# Patient Record
Sex: Female | Born: 1965
Health system: Southern US, Community
[De-identification: ages and names within clinical notes are randomized; demographics above are authoritative.]

## PROBLEM LIST (undated history)

## (undated) ENCOUNTER — Emergency Department (HOSPITAL_COMMUNITY): Admission: EM | Payer: Self-pay | Source: Home / Self Care

## (undated) DIAGNOSIS — F419 Anxiety disorder, unspecified: Secondary | ICD-10-CM

## (undated) DIAGNOSIS — K219 Gastro-esophageal reflux disease without esophagitis: Secondary | ICD-10-CM

## (undated) DIAGNOSIS — K589 Irritable bowel syndrome without diarrhea: Secondary | ICD-10-CM

## (undated) HISTORY — PX: OTHER SURGICAL HISTORY: SHX169

---

## 1998-09-21 ENCOUNTER — Other Ambulatory Visit: Admission: RE | Admit: 1998-09-21 | Discharge: 1998-09-21 | Payer: Self-pay | Admitting: *Deleted

## 1999-09-27 ENCOUNTER — Other Ambulatory Visit: Admission: RE | Admit: 1999-09-27 | Discharge: 1999-09-27 | Payer: Self-pay | Admitting: *Deleted

## 2000-09-27 ENCOUNTER — Other Ambulatory Visit: Admission: RE | Admit: 2000-09-27 | Discharge: 2000-09-27 | Payer: Self-pay | Admitting: *Deleted

## 2004-09-23 ENCOUNTER — Ambulatory Visit: Payer: Self-pay | Admitting: Family Medicine

## 2004-10-21 ENCOUNTER — Ambulatory Visit: Payer: Self-pay | Admitting: Family Medicine

## 2004-11-02 ENCOUNTER — Ambulatory Visit: Payer: Self-pay | Admitting: Family Medicine

## 2005-07-06 ENCOUNTER — Ambulatory Visit: Payer: Self-pay | Admitting: Family Medicine

## 2017-05-26 DIAGNOSIS — H5203 Hypermetropia, bilateral: Secondary | ICD-10-CM | POA: Diagnosis not present

## 2019-08-13 ENCOUNTER — Observation Stay (HOSPITAL_COMMUNITY): Payer: No Typology Code available for payment source

## 2019-08-13 ENCOUNTER — Emergency Department (HOSPITAL_COMMUNITY): Payer: No Typology Code available for payment source

## 2019-08-13 ENCOUNTER — Other Ambulatory Visit: Payer: Self-pay

## 2019-08-13 ENCOUNTER — Observation Stay (HOSPITAL_COMMUNITY)
Admission: EM | Admit: 2019-08-13 | Discharge: 2019-08-14 | Disposition: A | Discharge status: Home / Self Care | Payer: No Typology Code available for payment source | Attending: Family Medicine | Admitting: Family Medicine

## 2019-08-13 ENCOUNTER — Encounter (HOSPITAL_COMMUNITY): Payer: Self-pay

## 2019-08-13 DIAGNOSIS — R200 Anesthesia of skin: Secondary | ICD-10-CM

## 2019-08-13 DIAGNOSIS — Z20828 Contact with and (suspected) exposure to other viral communicable diseases: Secondary | ICD-10-CM | POA: Insufficient documentation

## 2019-08-13 DIAGNOSIS — Z79899 Other long term (current) drug therapy: Secondary | ICD-10-CM | POA: Diagnosis not present

## 2019-08-13 DIAGNOSIS — R42 Dizziness and giddiness: Secondary | ICD-10-CM | POA: Diagnosis not present

## 2019-08-13 DIAGNOSIS — G459 Transient cerebral ischemic attack, unspecified: Principal | ICD-10-CM

## 2019-08-13 DIAGNOSIS — M7989 Other specified soft tissue disorders: Secondary | ICD-10-CM | POA: Insufficient documentation

## 2019-08-13 DIAGNOSIS — R030 Elevated blood-pressure reading, without diagnosis of hypertension: Secondary | ICD-10-CM

## 2019-08-13 DIAGNOSIS — I10 Essential (primary) hypertension: Secondary | ICD-10-CM | POA: Diagnosis present

## 2019-08-13 HISTORY — DX: Gastro-esophageal reflux disease without esophagitis: K21.9

## 2019-08-13 HISTORY — DX: Anxiety disorder, unspecified: F41.9

## 2019-08-13 HISTORY — DX: Irritable bowel syndrome, unspecified: K58.9

## 2019-08-13 LAB — SARS CORONAVIRUS 2 (TAT 6-24 HRS): SARS Coronavirus 2: NEGATIVE

## 2019-08-13 LAB — CBC
HCT: 44.2 % (ref 36.0–46.0)
Hemoglobin: 14.4 g/dL (ref 12.0–15.0)
MCH: 29.1 pg (ref 26.0–34.0)
MCHC: 32.6 g/dL (ref 30.0–36.0)
MCV: 89.5 fL (ref 80.0–100.0)
Platelets: 261 10*3/uL (ref 150–400)
RBC: 4.94 MIL/uL (ref 3.87–5.11)
RDW: 13.2 % (ref 11.5–15.5)
WBC: 7.1 10*3/uL (ref 4.0–10.5)
nRBC: 0 % (ref 0.0–0.2)

## 2019-08-13 LAB — URINALYSIS, ROUTINE W REFLEX MICROSCOPIC
Bilirubin Urine: NEGATIVE
Glucose, UA: NEGATIVE mg/dL
Ketones, ur: NEGATIVE mg/dL
Nitrite: NEGATIVE
Protein, ur: NEGATIVE mg/dL
Specific Gravity, Urine: 1.003 — ABNORMAL LOW (ref 1.005–1.030)
pH: 7 (ref 5.0–8.0)

## 2019-08-13 LAB — BASIC METABOLIC PANEL
Anion gap: 10 (ref 5–15)
BUN: 8 mg/dL (ref 6–20)
CO2: 25 mmol/L (ref 22–32)
Calcium: 9.7 mg/dL (ref 8.9–10.3)
Chloride: 105 mmol/L (ref 98–111)
Creatinine, Ser: 0.66 mg/dL (ref 0.44–1.00)
GFR calc Af Amer: >60 mL/min (ref >60)
GFR calc non Af Amer: >60 mL/min (ref >60)
Glucose, Bld: 110 mg/dL — ABNORMAL HIGH (ref 70–99)
Potassium: 3.8 mmol/L (ref 3.5–5.1)
Sodium: 140 mmol/L (ref 135–145)

## 2019-08-13 LAB — RAPID URINE DRUG SCREEN, HOSP PERFORMED
Amphetamines: NOT DETECTED
Barbiturates: NOT DETECTED
Benzodiazepines: POSITIVE — AB
Cocaine: NOT DETECTED
Opiates: NOT DETECTED
Tetrahydrocannabinol: NOT DETECTED

## 2019-08-13 LAB — TROPONIN I (HIGH SENSITIVITY)
Troponin I (High Sensitivity): <2 ng/L (ref <18)
Troponin I (High Sensitivity): <2 ng/L (ref <18)

## 2019-08-13 LAB — HEMOGLOBIN A1C
Hgb A1c MFr Bld: 6.1 % — ABNORMAL HIGH (ref 4.8–5.6)
Mean Plasma Glucose: 128.37 mg/dL

## 2019-08-13 LAB — CBG MONITORING, ED: Glucose-Capillary: 94 mg/dL (ref 70–99)

## 2019-08-13 LAB — TSH: TSH: 0.865 u[IU]/mL (ref 0.350–4.500)

## 2019-08-13 LAB — HIV ANTIBODY (ROUTINE TESTING W REFLEX): HIV Screen 4th Generation wRfx: NONREACTIVE

## 2019-08-13 MED ORDER — LORAZEPAM 2 MG/ML IJ SOLN: 0.5000 mg | Freq: Once | INTRAMUSCULAR | Status: DC

## 2019-08-13 MED ORDER — LORAZEPAM 0.5 MG PO TABS
ORAL_TABLET | ORAL | Status: AC
  Administered 2019-08-13: 0.5 mg
  Filled 2019-08-13: qty 1

## 2019-08-13 MED ORDER — ASPIRIN EC 81 MG PO TBEC
81.0000 mg | DELAYED_RELEASE_TABLET | Freq: Every day | ORAL | Status: DC
  Administered 2019-08-14: 81 mg via ORAL
  Filled 2019-08-13: qty 1

## 2019-08-13 MED ORDER — ACETAMINOPHEN 325 MG PO TABS
650.0000 mg | ORAL_TABLET | Freq: Four times a day (QID) | ORAL | Status: DC | PRN
  Administered 2019-08-14: 650 mg via ORAL
  Filled 2019-08-13: qty 2

## 2019-08-13 MED ORDER — SODIUM CHLORIDE 0.9% FLUSH: 3.0000 mL | Freq: Once | INTRAVENOUS | Status: DC

## 2019-08-13 MED ORDER — ENOXAPARIN SODIUM 40 MG/0.4ML ~~LOC~~ SOLN
40.0000 mg | SUBCUTANEOUS | Status: DC
  Filled 2019-08-13: qty 0.4

## 2019-08-13 MED ORDER — HYDRALAZINE HCL 20 MG/ML IJ SOLN: 5.0000 mg | Freq: Four times a day (QID) | INTRAMUSCULAR | Status: DC | PRN

## 2019-08-13 NOTE — H&P (Signed)
History and Physical    Susan Abbott YBO:[id number] DOB: 05/17/1966 DOA: 08/13/2019  PCP: [facility] Patient coming from: Home  Chief Complaint: Right facial numbness and dizziness started at 11 AM today.  HPI: Susan Abbott is a 53 y.o. 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  She was at work today and she felt the sudden onset of right sided facial numbness probably lasted for 2 hours.  She checked her blood pressure it was 170s over 100s 3 times.  She took a hydrochlorothiazide which she takes as needed for fluid buildup.  She does not have a history of hypertension hyperlipidemia or diabetes.  She is not a smoker she does not drink alcohol.  When her blood pressure was that high she felt dizzy and she thought she was going to pass out but did not pass out.  This was also associated with some palpitations and left shoulder pain.  She thinks the whole episode lasted 1 to 2 hours.  When I saw her she was completely free of symptoms.  She denied dysphagia dyspnea chest pain shortness of breath nausea vomiting diarrhea. She denied changes with her vision she has a mild headache. She denied any urinary complaints. She works as a Publishing rights manager in the clinic so she must have had exposure to asymptomatic Covid patients.  But she herself has no symptoms or complaints of loss of taste or loss of smell. She denied any similar symptoms in the past.  ED Course: Blood pressure 1 4592 pulse is 68 respiration 15-25 sats 99% on room air temperature 99.2.  Sodium 140 potassium 3.8 BUN 8 creatinine 0.66 white count 7.1 hemoglobin 14 platelet count 261 glucose 110.  UA moderate amount of leukocytes.  Urine tox screen positive for benzodiazepines.  CT head and MRI is pending.  Review of Systems: As per HPI otherwise all other systems reviewed and are negative   Ambulatory Status: Patient is very active at baseline.  Past Medical History:  Diagnosis Date  . Anxiety   . GERD (gastroesophageal reflux disease)   . IBS (irritable bowel syndrome)     History reviewed. No pertinent surgical history.  Social History   Socioeconomic History  . Marital status: Married    Spouse name: Not on file  . Number of children: Not on file  . Years of education: Not on file  . Highest education level: Not on file  Occupational History  . Not on file  Social Needs  . Financial resource strain: Not on file  . Food insecurity    Worry: Not on file    Inability: Not on file  . Transportation needs    Medical: Not on file    Non-medical: Not on file  Tobacco Use  . Smoking status: Never Smoker  . Smokeless tobacco: Never Used  Substance and Sexual Activity  . Alcohol use: Never    Frequency: Never  . Drug use: Never  . Sexual activity: Not on file  Lifestyle  . Physical activity    Days per week: Not on file    Minutes per session: Not on file  . Stress: Not on file  Relationships  . Social Musician on phone: Not on file    Gets together: Not on file    Attends religious service: Not on file  Active member of club or organization: Not on file    Attends meetings of clubs or organizations: Not on file    Relationship status: Not on file  . Intimate partner violence    Fear of current or ex partner: Not on file    Emotionally abused: Not on file    Physically abused: Not on file    Forced sexual activity: Not on file  Other Topics Concern  . Not on file  Social History Narrative  . Not on file    Allergies  Allergen Reactions  . Doxycycline     Stomach cramps  . Shellfish Allergy   . Solu-Medrol [Methylprednisolone Sodium Succ]     "Goes Crazy"    History reviewed. No pertinent family history.    Prior to Admission medications   Medication Sig Start Date End Date Taking? Authorizing Provider  acetaminophen  (TYLENOL) 325 MG tablet Take 650 mg by mouth every 6 (six) hours as needed for mild pain.   Yes [provider]  diazepam (VALIUM) 5 MG tablet Take 5 mg by mouth daily as needed for anxiety.   Yes [provider]  hydrochlorothiazide (HYDRODIURIL) 25 MG tablet Take 25 mg by mouth daily as needed (fluid).    Yes [provider]    Physical Exam: Vitals:   08/13/19 1327 08/13/19 1329 08/13/19 1330  BP:  (!) 145/85 (!) 145/92  Pulse: 66 67 69  Resp: 17 18 (!) 25  Temp:  99 F (37.2 C) 99.2 F (37.3 C)  TempSrc:  Oral Oral  SpO2: 99% 98% 99%  Weight:  65.8 kg   Height:  5\' 3"  (1.6 m)      . General:  Appears calm and comfortable . Eyes: PERRL, EOMI, normal lids, iris . ENT:  grossly normal hearing, lips & tongue, mmm . Neck: no LAD, masses or thyromegaly . Cardiovascular:  RRR, no m/r/g. No LE edema.  Marland Kitchen Respiratory: CTA bilaterally, no w/r/r. Normal respiratory effort. . Abdomen:  soft, ntnd, NABS . Skin:  no rash or induration seen on limited exam . Musculoskeletal:  grossly normal tone BUE/BLE, good ROM, no bony abnormality . Psychiatric:  grossly normal mood and affect, speech fluent and appropriate, AOx3 . Neurologic:  CN 2-12 grossly intact, moves all extremities in coordinated fashion, sensation intact  Labs on Admission: I have personally reviewed following labs and imaging studies  CBC: Recent Labs  Lab 08/13/19 1328  WBC 7.1  HGB 14.4  HCT 44.2  MCV 89.5  PLT 619   Basic Metabolic Panel: Recent Labs  Lab 08/13/19 1328  NA 140  K 3.8  CL 105  CO2 25  GLUCOSE 110*  BUN 8  CREATININE 0.66  CALCIUM 9.7   GFR: Estimated Creatinine Clearance: 73.4 mL/min (by C-G formula based on SCr of 0.66 mg/dL). Liver Function Tests: No results for input(s): AST, ALT, ALKPHOS, BILITOT, PROT, ALBUMIN in the last 168 hours. No results for input(s): LIPASE, AMYLASE in the last 168 hours. No results for input(s): AMMONIA in the last 168 hours.  Coagulation Profile: No results for input(s): INR, PROTIME in the last 168 hours. Cardiac Enzymes: No results for input(s): CKTOTAL, CKMB, CKMBINDEX, TROPONINI in the last 168 hours. BNP (last 3 results) No results for input(s): PROBNP in the last 8760 hours. HbA1C: No results for input(s): HGBA1C in the last 72 hours. CBG: Recent Labs  Lab 08/13/19 1335  GLUCAP 94   Lipid Profile: No results for input(s): CHOL, HDL, LDLCALC,  TRIG, CHOLHDL, LDLDIRECT in the last 72 hours. Thyroid Function Tests: No results for input(s): TSH, T4TOTAL, FREET4, T3FREE, THYROIDAB in the last 72 hours. Anemia Panel: No results for input(s): VITAMINB12, FOLATE, FERRITIN, TIBC, IRON, RETICCTPCT in the last 72 hours. Urine analysis:    Component Value Date/Time   COLORURINE STRAW (A) 08/13/2019 1328   APPEARANCEUR CLEAR 08/13/2019 1328   LABSPEC 1.003 (L) 08/13/2019 1328   PHURINE 7.0 08/13/2019 1328   GLUCOSEU NEGATIVE 08/13/2019 1328   HGBUR SMALL (A) 08/13/2019 1328   BILIRUBINUR NEGATIVE 08/13/2019 1328   KETONESUR NEGATIVE 08/13/2019 1328   PROTEINUR NEGATIVE 08/13/2019 1328   NITRITE NEGATIVE 08/13/2019 1328   LEUKOCYTESUR MODERATE (A) 08/13/2019 1328    Creatinine Clearance: Estimated Creatinine Clearance: 73.4 mL/min (by C-G formula based on SCr of 0.66 mg/dL).  Sepsis Labs: (procalcitonin:4,lacticidven:4) )No results found for this or any previous visit (from the past 240 hour(s)).   Radiological Exams on Admission: No results found.  EKG: Reviewed by me no acute ST-T wave changes normal sinus rhythm  Assessment/Plan Active Problems:   TIA (transient ischemic attack)    #1 possible TIA-patient admitted with right-sided facial numbness and right-sided facial pain which lasted for less than 2 hours and has resolved completely by the time she came to the ER. ED PA spoke with Dr. Amada Jupiter who recommended admission to [facility].  He recommended a CT angio of  the head and neck along with MRI but the patient reported a prior history of contrast allergy and she is allergic to shellfish and she was not wanting [place] to have a CT angiogram done. Since she refused a CT angiogram I will order carotid ultrasound and echocardiogram. Check TSH hemoglobin A1c and lipid panel. Start asa   #2 hypertension patient with no history of hypertension she takes HCTZ at home as needed for edema her blood pressure was elevated today into 170s to 100 during the episode above.  I will put her on as needed hydralazine.  If blood pressure remains high she will need to be started on an oral agent.  #3 anxiety patient takes Valium as needed at home she reports she does not take it every day   #4 mild hyperglycemia check hemoglobin A1c  Severity of Illness: The appropriate patient status for this patient is OBSERVATION. Observation status is judged to be reasonable and necessary in order to provide the required intensity of service to ensure the patient's safety. The patient's presenting symptoms, physical exam findings, and initial radiographic and laboratory data in the context of their medical condition is felt to place them at decreased risk for further clinical deterioration. Furthermore, it is anticipated that the patient will be medically stable for discharge from the hospital within 2 midnights of admission. The following factors support the patient status of observation.   " The patient's presenting symptoms include right sided facial numbness and hypertension. " The physical exam findings include right facial numbness " The initial radiographic and laboratory data are pending   Estimated body mass index is 25.69 kg/m as calculated from the following:   Height as of this encounter:  (1.6 m).   Weight as of this encounter: 65.8 kg.   DVT prophylaxis: Lovenox Code Status: Full code Family Communication: Discussed with husband in the room Disposition Plan: Admit  to [facility] Consults called: ED physician discussed with Dr. Amada Jupiter Admission status: Observation   Alwyn Ren MD [facility]  If 7PM-7AM, please contact night-coverage [email or web address] Password  [email or web address]  08/13/2019, 4:26 PM

## 2019-08-13 NOTE — ED Notes (Signed)
Patient verbalized that she would like to speak to a provider about her care, stating that she would prefer to follow up with outpatient if able. This nurse explained that I would page the hospitalist to see if they would come speak with her.

## 2019-08-13 NOTE — ED Provider Notes (Addendum)
[facility]-EMERGENCY DEPT Provider Note   CSN: [id number] Arrival date & time: 08/13/19  1304     History   Chief Complaint Chief Complaint  Patient presents with   Hypertension   Dizziness    HPI Susan Abbott is a 53 y.o. female.     53 y.o female with a PMH of SVT presents to the ED via EMS with a chief complaint of HTN and dizziness. 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 She reports checking her BP by the LPN with reading between the 174 systolic and lower 100s diastolic.  She also reports after this check she felt like she was so weak and lightheaded that she could syncopized but did not do so.  During this episode she also felt some left shoulder pain briefly.  The episode lasted for a total of 30 minutes and resolved without any deficits.  On arrival patient endorses a headache, reports a dull sensation to the top of her head without any radiation or blurred vision.  Patient reports trying to take a 25 mg of HCTZ which she currently takes for fluid blood pressure.  She denies any prior history of hypertension, CAD, smoking.  She denies any shortness of breath, chest pain last saw a cardiologist "a decade ago" for her SVT.    The history is provided by the patient and medical records.      OB History   No obstetric history on file.      Home Medications    Prior to Admission medications   Medication Sig Start Date End Date Taking? Authorizing Provider  acetaminophen (TYLENOL) 325 MG tablet Take 650 mg by mouth every 6 (six) hours as needed for mild pain.   Yes [provider]  diazepam (VALIUM) 5 MG tablet Take 5 mg by mouth daily as needed for anxiety.   Yes [provider]  hydrochlorothiazide (HYDRODIURIL) 25 MG tablet Take 25 mg by mouth daily as needed (fluid).    Yes  [provider]    Family History No family history on file.  Social History Social History   Tobacco Use   Smoking status: Not on file  Substance Use Topics   Alcohol use: Not on file   Drug use: Not on file     Allergies   Doxycycline, Shellfish allergy, and Solu-medrol [methylprednisolone sodium succ]   Review of Systems Review of Systems  Constitutional: Negative for chills and fever.  HENT: Negative for ear pain and sore throat.   Eyes: Negative for pain and visual disturbance.  Respiratory: Negative for cough and shortness of breath.   Cardiovascular: Negative for chest pain and palpitations.  Gastrointestinal: Negative for abdominal pain, nausea and vomiting.  Genitourinary: Negative for dysuria and hematuria.  Musculoskeletal: Negative for arthralgias and back pain.  Skin: Negative for color change and rash.  Neurological: Positive for dizziness, syncope, weakness, light-headedness and headaches. Negative for seizures and speech difficulty.  Psychiatric/Behavioral: Negative for confusion.  All other systems reviewed and are negative.    Physical Exam Updated Vital Signs BP (!) 145/92    Pulse 69    Temp 99.2 F (37.3 C) (Oral)    Resp (!) 25    Ht 5\' 3"  (1.6 m)    Wt 65.8 kg    SpO2 99%    BMI 25.69  kg/m   Physical Exam Vitals signs and nursing note reviewed.  Constitutional:      Appearance: Normal appearance.  HENT:     Head: Normocephalic and atraumatic.     Nose: Nose normal.     Mouth/Throat:     Mouth: Mucous membranes are moist.  Eyes:     General: No scleral icterus.       Right eye: No discharge.        Left eye: No discharge.     Extraocular Movements: Extraocular movements intact.     Pupils: Pupils are equal, round, and reactive to light.  Neck:     Musculoskeletal: Normal range of motion and neck supple.  Cardiovascular:     Rate and Rhythm: Normal rate.  Pulmonary:     Effort: Pulmonary effort is normal.     Breath  sounds: Normal breath sounds. No wheezing or rhonchi.  Abdominal:     General: Abdomen is flat.  Neurological:     Mental Status: She is alert and oriented to person, place, and time.     Comments: Alert, oriented, thought content appropriate. Speech fluent without evidence of aphasia. Able to follow 2 step commands without difficulty.  Cranial Nerves:  II:  Peripheral visual fields grossly normal, pupils, round, reactive to light III,IV, VI: ptosis not present, extra-ocular motions intact bilaterally  V,VII: smile symmetric, facial light touch sensation equal VIII: hearing grossly normal bilaterally  IX,X: midline uvula rise  XI: bilateral shoulder shrug equal and strong XII: midline tongue extension  Motor:  5/5 in upper and lower extremities bilaterally including strong and equal grip strength and dorsiflexion/plantar flexion Sensory: light touch normal in all extremities.  Cerebellar: normal finger-to-nose with bilateral upper extremities, pronator drift negative        ED Treatments / Results  Labs (all labs ordered are listed, but only abnormal results are displayed) Labs Reviewed  BASIC METABOLIC PANEL - Abnormal; Notable for the following components:      Result Value   Glucose, Bld 110 (*)    All other components within normal limits  URINALYSIS, ROUTINE W REFLEX MICROSCOPIC - Abnormal; Notable for the following components:   Color, Urine STRAW (*)    Specific Gravity, Urine 1.003 (*)    Hgb urine dipstick SMALL (*)    Leukocytes,Ua MODERATE (*)    Bacteria, UA RARE (*)    Non Squamous Epithelial 0-5 (*)    All other components within normal limits  RAPID URINE DRUG SCREEN, HOSP PERFORMED - Abnormal; Notable for the following components:   Benzodiazepines POSITIVE (*)    All other components within normal limits  SARS CORONAVIRUS 2 (TAT 6-24 HRS)  CBC  CBG MONITORING, ED  TROPONIN I (HIGH SENSITIVITY)  TROPONIN I (HIGH SENSITIVITY)     EKG None  Radiology No results found.  Procedures Procedures (including critical care time)  Medications Ordered in ED Medications  sodium chloride flush (NS) 0.9 % injection 3 mL (3 mLs Intravenous Not Given 08/13/19 1416)     Initial Impression / Assessment and Plan / ED Course  I have reviewed the triage vital signs and the nursing notes.  Pertinent labs & imaging results that were available during my care of the patient were reviewed by me and considered in my medical decision making (see chart for details).  Clinical Course as of Aug 12 1541  Tue Aug 13, 2019  1533 Benzodiazepines(!): POSITIVE [JS]  1533 Chalmers Guest): MODERATE [JS]  1533 WBC, UA: 6-10 [JS]  Clinical Course User Index [JS] Claude Manges, PA-C      Patient with no past medical history presents to the ED with complaints of right-sided facial numbness, right-sided jaw pain along with left shoulder pain which began while she was at work.  Patient is currently employed as an NP, reports measuring her blood pressure with a systolic rate around the 170s along with a diastolic rate along the 100s.  She reports she felt like she was unable to ambulate as she felt very weak, has some dizziness along with lightheadedness.  The symptoms self resolved within 30 minutes.  Patient has not had any similar episodes in the past, does not have a history of CAD, has no previous history of strokes, currently not on any blood thinners and denies any trauma.  Patient did take a 25 mg of HCTZ which she currently uses for fluid not blood pressure management.  She denies any smoking history. Suspicion for TIA as symptoms have now self resolved.  She did arrive in the ED with some mild tachypnea, blood pressure slightly elevated at 145/92 but she is overall well-appearing.  Neuro exam performed by EMS negative.  Patient was neurologically screened by me, no deficits on my exam.  Patient does voice a prior history of SVT, was  previously released by cardiologist several years ago.  Has not had any follow-up since.  BMP without any electrolyte derangement, glucose is slightly elevated.  Kidney level is within normal limits.  CBC is unremarkable.  UDS did test positive for benzos.  UA does have moderate amount of leukocytes, some rare bacteria she denies any urinary symptoms at this time.  Will call neurology on-call for further recommendations.  EKG without any changes consistent with infarct or STEMI.  First troponin is negative.  Low suspicion for ACS, symptoms seem somewhat atypical.  2:43 PM Spoke to Dr. Amada Jupiter of neurology who commended admission under hospitalist service.  Patient will require transfer to [facility].  Due to delay in transfers, CT angio head and neck along with MRI will be ordered while in the ED at [facility].  3:34 PM patient reports a prior history of contrast allergy.  She reports she has never received contrast as she is currently allergic to shellfish.  Will defer CT at this time per patient's request.  3:41 PM spoke to Dr. Ashley Royalty hospitalist service who will admit patient for further management and TIA admission.  Of note, patient did refuse contrast for me.  Will be obtaining CT head along with MRI brain.  COVID-19 testing has been ordered.  I have discussed patient with Dr. Stevie Kern who has seen patient, and agrees with management.    Portions of this note were generated with Scientist, clinical (histocompatibility and immunogenetics). Dictation errors may occur despite best attempts at proofreading.  Final Clinical Impressions(s) / ED Diagnoses   Final diagnoses:  Facial numbness  Elevated blood pressure reading  Dizziness    ED Discharge Orders    None       Claude Manges, PA-C 08/13/19 1542    Claude Manges, PA-C 08/13/19 1543    Milagros Loll, MD 08/14/19 1013

## 2019-08-13 NOTE — ED Notes (Signed)
Patient ambulated to the restroom with no difficulty   ° °

## 2019-08-13 NOTE — ED Notes (Signed)
[facility] arrived for transport 

## 2019-08-13 NOTE — ED Notes (Signed)
Hospitalist paged

## 2019-08-13 NOTE — ED Notes (Signed)
[facility] called for transport. 

## 2019-08-13 NOTE — ED Notes (Signed)
Patient transported to MRI 

## 2019-08-13 NOTE — ED Triage Notes (Signed)
[facility]. Patient reportedly had bout of high BP, and right sided facial numbness. Patient reports sensation differences in EMS truck. Stroke screen negative with EMS.  Patient denies hx of HTN. Patient reports numbness has resolved at this time. Patient was given HCTZ on scene.

## 2019-08-13 NOTE — ED Notes (Signed)
Attempted to call report, nurse unavailable at this time.

## 2019-08-14 ENCOUNTER — Observation Stay (HOSPITAL_BASED_OUTPATIENT_CLINIC_OR_DEPARTMENT_OTHER): Payer: No Typology Code available for payment source

## 2019-08-14 ENCOUNTER — Encounter (HOSPITAL_COMMUNITY): Payer: Self-pay | Admitting: Neurology

## 2019-08-14 DIAGNOSIS — G459 Transient cerebral ischemic attack, unspecified: Secondary | ICD-10-CM

## 2019-08-14 DIAGNOSIS — R200 Anesthesia of skin: Secondary | ICD-10-CM | POA: Diagnosis not present

## 2019-08-14 LAB — ECHOCARDIOGRAM COMPLETE
Height: 63 in
Weight: 2320 oz

## 2019-08-14 LAB — BASIC METABOLIC PANEL
Anion gap: 12 (ref 5–15)
BUN: 9 mg/dL (ref 6–20)
CO2: 23 mmol/L (ref 22–32)
Calcium: 9.4 mg/dL (ref 8.9–10.3)
Chloride: 104 mmol/L (ref 98–111)
Creatinine, Ser: 0.9 mg/dL (ref 0.44–1.00)
GFR calc Af Amer: >60 mL/min (ref >60)
GFR calc non Af Amer: >60 mL/min (ref >60)
Glucose, Bld: 133 mg/dL — ABNORMAL HIGH (ref 70–99)
Potassium: 3.9 mmol/L (ref 3.5–5.1)
Sodium: 139 mmol/L (ref 135–145)

## 2019-08-14 MED ORDER — ASPIRIN 81 MG PO TBEC: 81.0000 mg | DELAYED_RELEASE_TABLET | Freq: Every day | ORAL | 0 refills | Status: AC

## 2019-08-14 MED ORDER — DIAZEPAM 5 MG PO TABS: 5.0000 mg | ORAL_TABLET | Freq: Once | ORAL | Status: DC

## 2019-08-14 NOTE — Plan of Care (Signed)
Adequate for discharge.

## 2019-08-14 NOTE — Progress Notes (Signed)
VASCULAR LAB PRELIMINARY  PRELIMINARY  PRELIMINARY  PRELIMINARY  Carotid duplex completed.    Preliminary report:  See CV proc for preliminary results.   Jannessa Ogden, RVT 08/14/2019, 12:05 PM

## 2019-08-14 NOTE — Progress Notes (Signed)
  Echocardiogram 2D Echocardiogram has been performed.  Lilah, Mijangos 08/14/2019, 10:56 AM

## 2019-08-14 NOTE — Discharge Summary (Addendum)
Physician Discharge Summary  [name] EAV:[id number] DOB: 1966-09-08 DOA: 08/13/2019  PCP: [facility]  Admit date: 08/13/2019 Discharge date: 08/14/2019  Admitted From: Home Disposition: Home  Recommendations for Outpatient Follow-up:  1. Follow up with PCP in 1-2 weeks 2. Please have MRA of the head and lipid panel checked as an outpatient 3. Please obtain BMP/CBC in one week 4. Please follow up on the following pending results:  Home Health: None Equipment/Devices: None  Discharge Condition: Stable CODE STATUS: Full code Diet recommendation: Regular  Subjective: Seen and examined she has no complaints.  Her numbness on the right face had resolved soon before coming to the ER and she does not have any focal deficit or any other complaint.  Brief/Interim Summary: Susan Abbott is a 53 y.o. 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  She was at work today and she felt the sudden onset of right sided facial numbness probably lasted for 2 hours.  She checked her blood pressure it was 170s over 100s 3 times.  She took a hydrochlorothiazide which she takes as needed for fluid buildup.  She does not have a history of hypertension hyperlipidemia or diabetes.  She is not a smoker she does not drink alcohol.  When her blood pressure was that high she felt dizzy and she thought she was going to pass out but did not pass out.  This was also associated with some palpitations and left shoulder pain.  She came to ED to seek further medical attention.  She was tested negative for COVID-19.  Her vitals were stable in the emergency department and work-up was almost unremarkable.  CT of head followed by MRI of the brain was unremarkable for any acute pathology.  Transthoracic echo was unremarkable with normal ejection fraction and no wall motion abnormality.   Carotid Doppler also did not show significant stenosis.  She was evaluated by neurology and they had recommended MRI of the head and lipid panel however patient did not want to wait for that and wanted to leave AGAINST MEDICAL ADVICE if not done on time.  I discussed case with Dr. Amada Jupiter from neurology and he was okay with patient going home and having those done as outpatient.  Based on the work-up, she likely had a TIA.  She has been discharged in stable condition.  Discharge Diagnoses:  Active Problems:   TIA (transient ischemic attack)    Discharge Instructions  Discharge Instructions    Discharge patient   Complete by: As directed    Discharge disposition: 01-Home or Self Care   Discharge patient date: 08/14/2019     Allergies as of 08/14/2019      Reactions   Doxycycline    Stomach cramps   Shellfish Allergy    Solu-medrol [methylprednisolone Sodium Succ]    "Goes Crazy"      Medication List    TAKE these medications   acetaminophen 325 MG tablet Commonly known as: TYLENOL Take 650 mg by mouth every 6 (six) hours as needed for mild pain.   aspirin 81 MG EC tablet Take 1 tablet (81 mg total) by mouth daily. Start taking on: August 15, 2019   diazepam 5 MG tablet Commonly known as: VALIUM Take 5 mg by mouth daily as needed for anxiety.   hydrochlorothiazide 25 MG tablet Commonly known as: HYDRODIURIL Take 25  mg by mouth daily as needed (fluid).      Follow-up Information    [facility] Follow up in 1 week(s).   Specialty: Internal Medicine Contact information: [address] [place] [address] [phone]          Allergies  Allergen Reactions  . Doxycycline     Stomach cramps  . Shellfish Allergy   . Solu-Medrol [Methylprednisolone Sodium Succ]     "Goes Crazy"    Consultations: Neurology   Procedures/Studies: Ct Head Wo Contrast  Result Date: 08/13/2019 CLINICAL DATA:  Right-sided facial numbness EXAM: CT HEAD  WITHOUT CONTRAST TECHNIQUE: Contiguous axial images were obtained from the base of the skull through the vertex without intravenous contrast. COMPARISON:  None. FINDINGS: Brain: No evidence of acute infarction, hemorrhage, hydrocephalus, extra-axial collection or mass lesion/mass effect. Vascular: No hyperdense vessel or unexpected calcification. Skull: Normal. Negative for fracture or focal lesion. Sinuses/Orbits: No acute finding. Other: None. IMPRESSION: No focal acute intracranial abnormality identified. Electronically Signed   By: Sherian Rein M.D.   On: 08/13/2019 16:39   Mr Brain Wo Contrast  Result Date: 08/13/2019 CLINICAL DATA:  Hypertension.  Right-sided facial numbness. EXAM: MRI HEAD WITHOUT CONTRAST TECHNIQUE: Multiplanar, multiecho pulse sequences of the brain and surrounding structures were obtained without intravenous contrast. COMPARISON:  None. FINDINGS: Brain: Diffusion imaging does not show any acute or subacute infarction. Cerebral hemispheres show scattered foci of T2 and FLAIR signal within the deep and subcortical white matter consistent with small vessel disease. One could consider the possibility of demyelinating disease but I think that is less likely. No cortical or large vessel territory infarction. No mass lesion, hemorrhage, hydrocephalus or extra-axial collection. Vascular: Major vessels at the base of the brain show flow. Skull and upper cervical spine: Negative Sinuses/Orbits: Clear/normal Other: None IMPRESSION: No acute finding. Scattered foci of T2 and FLAIR signal within the cerebral hemispheric white matter most consistent with chronic small vessel disease. One could consider the possibility of demyelinating disease/multiple sclerosis, but I think that is less likely given this pattern. Electronically Signed   By: Paulina Fusi M.D.   On: 08/13/2019 18:16   Vas US Carotid  Result Date: 08/14/2019 Carotid Arterial Duplex Study Indications:       TIA and Numbness. Risk  Factors:      Hypertension. Other Factors:     Hypertensive emergency. Comparison Study:  No prior study on file Performing Technologist: Sherren Kerns RVS  Examination Guidelines: A complete evaluation includes B-mode imaging, spectral Doppler, color Doppler, and power Doppler as needed of all accessible portions of each vessel. Bilateral testing is considered an integral part of a complete examination. Limited examinations for reoccurring indications may be performed as noted.  Right Carotid Findings: +----------+--------+--------+--------+------------------+------------------+           PSV cm/sEDV cm/sStenosisPlaque DescriptionComments           +----------+--------+--------+--------+------------------+------------------+ CCA Prox  97      24                                intimal thickening +----------+--------+--------+--------+------------------+------------------+ CCA Distal80      24                                intimal thickening +----------+--------+--------+--------+------------------+------------------+ ICA Prox  61      21                                                   +----------+--------+--------+--------+------------------+------------------+  ICA Distal82      32                                                   +----------+--------+--------+--------+------------------+------------------+ ECA       81      9                                                    +----------+--------+--------+--------+------------------+------------------+ +----------+--------+-------+--------+-------------------+           PSV cm/sEDV cmsDescribeArm Pressure (mmHG) +----------+--------+-------+--------+-------------------+ [id number]                                         +----------+--------+-------+--------+-------------------+ +---------+--------+--+--------+--+ VertebralPSV cm/s42EDV cm/s14 +---------+--------+--+--------+--+  Left Carotid  Findings: +----------+--------+--------+--------+------------------+------------------+           PSV cm/sEDV cm/sStenosisPlaque DescriptionComments           +----------+--------+--------+--------+------------------+------------------+ CCA Prox  107     32                                intimal thickening +----------+--------+--------+--------+------------------+------------------+ CCA Distal96      27                                intimal thickening +----------+--------+--------+--------+------------------+------------------+ ICA Prox  59      22                                                   +----------+--------+--------+--------+------------------+------------------+ ICA Distal70      30                                                   +----------+--------+--------+--------+------------------+------------------+ ECA       117     18                                                   +----------+--------+--------+--------+------------------+------------------+ +----------+--------+--------+--------+-------------------+           PSV cm/sEDV cm/sDescribeArm Pressure (mmHG) +----------+--------+--------+--------+-------------------+ [id number]                                          +----------+--------+--------+--------+-------------------+ +---------+--------+--+--------+--+ VertebralPSV cm/s51EDV cm/s14 +---------+--------+--+--------+--+  Summary: Right Carotid: The extracranial vessels were near-normal with only minimal wall                thickening or plaque. Left Carotid: The extracranial vessels were near-normal with only minimal wall  thickening or plaque. Vertebrals:  Bilateral vertebral arteries demonstrate antegrade flow. Subclavians: Normal flow hemodynamics were seen in bilateral subclavian              arteries. *See table(s) above for measurements and observations.     Preliminary    Vas [place] Lower Extremity Venous  (dvt)  Result Date: 08/14/2019  Lower Venous Study Indications: Edema, and TIA.  Comparison Study: No prior study on file for comparison Performing Technologist: Sherren Kernsandace Kanady RVS  Examination Guidelines: A complete evaluation includes B-mode imaging, spectral Doppler, color Doppler, and power Doppler as needed of all accessible portions of each vessel. Bilateral testing is considered an integral part of a complete examination. Limited examinations for reoccurring indications may be performed as noted.  +---------+---------------+---------+-----------+----------+--------------+ RIGHT    CompressibilityPhasicitySpontaneityPropertiesThrombus Aging +---------+---------------+---------+-----------+----------+--------------+ CFV      Full           Yes      Yes                                 +---------+---------------+---------+-----------+----------+--------------+ SFJ      Full                                                        +---------+---------------+---------+-----------+----------+--------------+ FV Prox  Full                                                        +---------+---------------+---------+-----------+----------+--------------+ FV Mid   Full                                                        +---------+---------------+---------+-----------+----------+--------------+ FV DistalFull                                                        +---------+---------------+---------+-----------+----------+--------------+ PFV      Full                                                        +---------+---------------+---------+-----------+----------+--------------+ POP      Full           Yes      Yes                                 +---------+---------------+---------+-----------+----------+--------------+ PTV      Full                                                         +---------+---------------+---------+-----------+----------+--------------+  PERO     Full                                                        +---------+---------------+---------+-----------+----------+--------------+   +---------+---------------+---------+-----------+----------+--------------+ LEFT     CompressibilityPhasicitySpontaneityPropertiesThrombus Aging +---------+---------------+---------+-----------+----------+--------------+ CFV      Full           Yes      Yes                                 +---------+---------------+---------+-----------+----------+--------------+ SFJ      Full                                                        +---------+---------------+---------+-----------+----------+--------------+ FV Prox  Full                                                        +---------+---------------+---------+-----------+----------+--------------+ FV Mid   Full                                                        +---------+---------------+---------+-----------+----------+--------------+ FV DistalFull                                                        +---------+---------------+---------+-----------+----------+--------------+ PFV      Full                                                        +---------+---------------+---------+-----------+----------+--------------+ POP      Full           Yes      Yes                                 +---------+---------------+---------+-----------+----------+--------------+ PTV      Full                                                        +---------+---------------+---------+-----------+----------+--------------+ PERO     Full                                                        +---------+---------------+---------+-----------+----------+--------------+  Summary: Right: There is no evidence of deep vein thrombosis in the lower extremity. Left: There is no evidence of deep  vein thrombosis in the lower extremity.  *See table(s) above for measurements and observations. Electronically signed by Harold Barban MD on 08/14/2019 at 1:10:53 PM.    Final       Discharge Exam: Vitals:   08/14/19 0354 08/14/19 0825  BP: 118/77 (!) 134/92  Pulse: (!) 56 65  Resp: 18   Temp: 98.1 F (36.7 C) 97.9 F (36.6 C)  SpO2: 97% 100%   Vitals:   08/14/19 0021 08/14/19 0210 08/14/19 0354 08/14/19 0825  BP: (!) 131/93 118/86 118/77 (!) 134/92  Pulse: 70  (!) 56 65  Resp:   18   Temp: 98.2 F (36.8 C) 98 F (36.7 C) 98.1 F (36.7 C) 97.9 F (36.6 C)  TempSrc: Oral Oral Oral Oral  SpO2: 97% 99% 97% 100%  Weight:      Height:        General: Pt is alert, awake, not in acute distress Cardiovascular: RRR, S1/S2 +, no rubs, no gallops Respiratory: CTA bilaterally, no wheezing, no rhonchi Abdominal: Soft, NT, ND, bowel sounds + Extremities: no edema, no cyanosis    The results of significant diagnostics from this hospitalization (including imaging, microbiology, ancillary and laboratory) are listed below for reference.     Microbiology: Recent Results (from the past 240 hour(s))  SARS CORONAVIRUS 2 (TAT 6-24 HRS) Nasopharyngeal Nasopharyngeal Swab     Status: None   Collection Time: 08/13/19  3:31 PM   Specimen: Nasopharyngeal Swab  Result Value Ref Range Status   SARS Coronavirus 2 NEGATIVE NEGATIVE Final    Comment: (NOTE) SARS-CoV-2 target nucleic acids are NOT DETECTED. The SARS-CoV-2 RNA is generally detectable in upper and lower respiratory specimens during the acute phase of infection. Negative results do not preclude SARS-CoV-2 infection, do not rule out co-infections with other pathogens, and should not be used as the sole basis for treatment or other patient management decisions. Negative results must be combined with clinical observations, patient history, and epidemiological information. The expected result is Negative. Fact Sheet for  Patients: SugarRoll.be Fact Sheet for Healthcare Providers: https://www.woods-mathews.com/ This test is not yet approved or cleared by the [place] FDA and  has been authorized for detection and/or diagnosis of SARS-CoV-2 by FDA under an Emergency Use Authorization (EUA). This EUA will remain  in effect (meaning this test can be used) for the duration of the COVID-19 declaration under Section 56 4(b)(1) of the Act, 21 U.S.C. section 360bbb-3(b)(1), unless the authorization is terminated or revoked sooner. Performed at [facility], [place] [address], [place], [place] [address]      Labs: BNP (last 3 results) No results for input(s): BNP in the last 8760 hours. Basic Metabolic Panel: Recent Labs  Lab 08/13/19 1328 08/14/19 0859  NA 140 139  K 3.8 3.9  CL 105 104  CO2 25 23  GLUCOSE 110* 133*  BUN 8 9  CREATININE 0.66 0.90  CALCIUM 9.7 9.4   Liver Function Tests: No results for input(s): AST, ALT, ALKPHOS, BILITOT, PROT, ALBUMIN in the last 168 hours. No results for input(s): LIPASE, AMYLASE in the last 168 hours. No results for input(s): AMMONIA in the last 168 hours. CBC: Recent Labs  Lab 08/13/19 1328  WBC 7.1  HGB 14.4  HCT 44.2  MCV 89.5  PLT 261   Cardiac Enzymes: No results for input(s): CKTOTAL, CKMB, CKMBINDEX, TROPONINI in  the last 168 hours. BNP: Invalid input(s): POCBNP CBG: Recent Labs  Lab 08/13/19 1335  GLUCAP 94   D-Dimer No results for input(s): DDIMER in the last 72 hours. Hgb A1c Recent Labs    08/13/19 1625  HGBA1C 6.1*   Lipid Profile No results for input(s): CHOL, HDL, LDLCALC, TRIG, CHOLHDL, LDLDIRECT in the last 72 hours. Thyroid function studies Recent Labs    08/13/19 1625  TSH 0.865   Anemia work up No results for input(s): VITAMINB12, FOLATE, FERRITIN, TIBC, IRON, RETICCTPCT in the last 72 hours. Urinalysis    Component Value Date/Time   COLORURINE STRAW (A)  08/13/2019 1328   APPEARANCEUR CLEAR 08/13/2019 1328   LABSPEC 1.003 (L) 08/13/2019 1328   PHURINE 7.0 08/13/2019 1328   GLUCOSEU NEGATIVE 08/13/2019 1328   HGBUR SMALL (A) 08/13/2019 1328   BILIRUBINUR NEGATIVE 08/13/2019 1328   KETONESUR NEGATIVE 08/13/2019 1328   PROTEINUR NEGATIVE 08/13/2019 1328   NITRITE NEGATIVE 08/13/2019 1328   LEUKOCYTESUR MODERATE (A) 08/13/2019 1328   Sepsis Labs Invalid input(s): PROCALCITONIN,  WBC,  LACTICIDVEN Microbiology Recent Results (from the past 240 hour(s))  SARS CORONAVIRUS 2 (TAT 6-24 HRS) Nasopharyngeal Nasopharyngeal Swab     Status: None   Collection Time: 08/13/19  3:31 PM   Specimen: Nasopharyngeal Swab  Result Value Ref Range Status   SARS Coronavirus 2 NEGATIVE NEGATIVE Final    Comment: (NOTE) SARS-CoV-2 target nucleic acids are NOT DETECTED. The SARS-CoV-2 RNA is generally detectable in upper and lower respiratory specimens during the acute phase of infection. Negative results do not preclude SARS-CoV-2 infection, do not rule out co-infections with other pathogens, and should not be used as the sole basis for treatment or other patient management decisions. Negative results must be combined with clinical observations, patient history, and epidemiological information. The expected result is Negative. Fact Sheet for Patients: HairSlick.no Fact Sheet for Healthcare Providers: quierodirigir.com This test is not yet approved or cleared by the [place] FDA and  has been authorized for detection and/or diagnosis of SARS-CoV-2 by FDA under an Emergency Use Authorization (EUA). This EUA will remain  in effect (meaning this test can be used) for the duration of the COVID-19 declaration under Section 56 4(b)(1) of the Act, 21 U.S.C. section 360bbb-3(b)(1), unless the authorization is terminated or revoked sooner. Performed at [facility], [address],  [place], [place] [address]      Time coordinating discharge: Over 30 minutes  SIGNED:   Hughie Closs, MD  [facility] 08/14/2019, 2:29 PM  If 7PM-7AM, please contact night-coverage [email or web address] Password [email or web address]

## 2019-08-14 NOTE — Discharge Instructions (Signed)
Transient Ischemic Attack ° °A transient ischemic attack (TIA) is a "warning stroke" that causes stroke-like symptoms that go away quickly. A TIA does not cause lasting damage to the brain. But having a TIA is a sign that you may be at risk for a stroke. Lifestyle changes and medical treatments can help prevent a stroke. °It is important to know the symptoms of a TIA and what to do. Get help right away, even if your symptoms go away. The symptoms of a TIA are the same as those of a stroke. They can happen fast, and they usually go away within minutes or hours. They can include: °· Weakness or loss of feeling in your face, arm, or leg. This often happens on one side of your body. °· Trouble walking. °· Trouble moving your arms or legs. °· Trouble talking or understanding what people are saying. °· Trouble seeing. °· Seeing two of one object (double vision). °· Feeling dizzy. °· Feeling confused. °· Loss of balance or coordination. °· Feeling sick to your stomach (nauseous) and throwing up (vomiting). °· A very bad headache for no reason. °What increases the risk? °Certain things may make you more likely to have a TIA. Some of these are things that you can change, such as: °· Being very overweight (obese). °· Using products that contain nicotine or tobacco, such as cigarettes and e-cigarettes. °· Taking birth control pills. °· Not being active. °· Drinking too much alcohol. °· Using drugs. °Other risk factors include: °· Having an irregular heartbeat (atrial fibrillation). °· Being African American or Hispanic. °· Having had blood clots, stroke, TIA, or heart attack in the past. °· Being a woman with a history of high blood pressure in pregnancy (preeclampsia). °· Being over the age of 60. °· Being female. °· Having family history of stroke. °· Having the following diseases or conditions: °? High blood pressure. °? High cholesterol. °? Diabetes. °? Heart disease. °? Sickle cell disease. °? Sleep apnea. °? Migraine  headache. °? Long-term (chronic) diseases that cause soreness and swelling (inflammation). °? Disorders that affect how your blood clots. °Follow these instructions at home: °Medicines ° °· Take over-the-counter and prescription medicines only as told by your doctor. °· If you were told to take aspirin or another medicine to thin your blood, take it exactly as told by your doctor. °? Taking too much of the medicine can cause bleeding. °? Taking too little of the medicine may not work to treat the problem. °Eating and drinking ° °· Eat 5 or more servings of fruits and vegetables each day. °· Follow instructions from your doctor about your diet. You may need to follow a certain diet to help lower your risk of having a stroke. You may need to: °? Eat a diet that is low in fat and salt. °? Eat foods that contain a lot of fiber. °? Limit the amount of carbohydrates and sugar in your diet. °· Limit alcohol intake to 1 drink a day for nonpregnant women and 2 drinks a day for men. One drink equals 12 oz of beer, 5 oz of wine, or 1½ oz of hard liquor. °General instructions °· Keep a healthy weight. °· Stay active. Try to get at least 30 minutes of activity on all or most days. °· Find out if you have a condition called sleep apnea. Get treatment if needed. °· Do not use any products that contain nicotine or tobacco, such as cigarettes and e-cigarettes. If you need help quitting,   ask your doctor. °· Do not abuse drugs. °· Keep all follow-up visits as told by your doctor. This is important. °Get help right away if: °· You have any signs of stroke. "BE FAST" is an easy way to remember the main warning signs: °? B - Balance. Signs are dizziness, sudden trouble walking, or loss of balance. °? E - Eyes. Signs are trouble seeing or a sudden change in how you see. °? F - Face. Signs are sudden weakness or loss of feeling of the face, or the face or eyelid drooping on one side. °? A - Arms. Signs are weakness or loss of feeling in an  arm. This happens suddenly and usually on one side of the body. °? S - Speech. Signs are sudden trouble speaking, slurred speech, or trouble understanding what people say. °? T - Time. Time to call emergency services. Write down what time symptoms started. °· You have other signs of stroke, such as: °? A sudden, very bad headache with no known cause. °? Feeling sick to your stomach (nausea). °? Throwing up (vomiting). °? Jerky movements that you cannot control (seizure). °These symptoms may be an emergency. Do not wait to see if the symptoms will go away. Get medical help right away. Call your local emergency services (911 in the [place] Do not drive yourself to the hospital. °Summary °· A transient ischemic attack (TIA) is a "warning stroke" that causes stroke-like symptoms that go away quickly. °· A TIA is a medical emergency. Get help right away, even if your symptoms go away. °· A TIA does not cause lasting damage to the brain. °· Having a TIA is a sign that you may be at risk for a stroke. Lifestyle changes and medical treatments can help prevent a stroke. °This information is not intended to replace advice given to you by your health care provider. Make sure you discuss any questions you have with your health care provider. °Document Released: 07/12/2008 Document Revised: 06/29/2018 Document Reviewed: 01/04/2017 °Elsevier Patient Education © 2020 [employer] ° °

## 2019-08-14 NOTE — TOC Transition Note (Signed)
Transition of Care [facility]) - CM/SW Discharge Note   Patient Details  Name: Susan Abbott MRN: [id number] Date of Birth: 1966/06/07  [facility] CM/SW Contact:  Pollie Friar, RN Phone Number: 08/14/2019, 3:52 PM   Clinical Narrative:    Pt discharged home with self care. No further needs per CM.   Final next level of care: Home/Self Care Barriers to Discharge: No Barriers Identified   Patient Goals and CMS Choice        Discharge Placement                       Discharge Plan and Services                                     Social Determinants of Health (SDOH) Interventions     Readmission Risk Interventions No flowsheet data found.

## 2019-08-14 NOTE — Progress Notes (Signed)
VASCULAR LAB PRELIMINARY  PRELIMINARY  PRELIMINARY  PRELIMINARY  Bilateral lower extremity venous duplex completed.    Preliminary report:  See CV proc for preliminary results.   Gulianna Hornsby, RVT 08/14/2019, 12:12 PM

## 2019-08-14 NOTE — Consult Note (Addendum)
Neurology Consultation  Reason for Consult: Possible TIA Referring Physician: Pahwani  History is obtained from: Patient  HPI: Susan Abbott is a 53 y.o. female with past medical history of IBS, GERD, anxiety.  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  Patient took her blood pressure at work and noted it was 170/100.  She had not taken her hydrochlorothiazide that day.  After taking her hydrochlorothiazide she noted her blood pressure decreased to 140 systolically.  At that time she noted she had a dull headache.  She rated a 4/10.  She states that the symptoms of numbness which again was very localized lasted for approximately 1 hour and then totally dissipated.  Patient states that she did the stroke screen as far as lifting her arms, looking in the mirror for any facial droop.  She states that she does have a previous right facial droop that is old but she had no significant facial droop that was new.  She had no localizing or lateralizing symptoms in the arms or legs.  But she did notice that she was slightly off with her walking.  She denies any blurred vision, photophobia, phonophobia.  At this time patient is symptom-free.  Patient denies history of migraine headaches other than possibly twice in the past but not consistent.  She states that she does not take aspirin on a daily basis.   ED course: MRI brain, labs   Chart review (no past neurological office visits or hospital visits)  LKW: 1130 on 08/13/2019 tpa given?: no, symptoms resolved Premorbid modified Rankin scale (mRS): 0 NIH stroke score 0   Past Medical History:  Diagnosis Date  . Anxiety   . GERD (gastroesophageal reflux disease)   . IBS (irritable bowel syndrome)      Family History  Problem Relation Age of Onset  . Hypertension  Mother      Social History:   reports that she has never smoked. She has never used smokeless tobacco. She reports that she does not drink alcohol or use drugs.  Medications  Current Facility-Administered Medications:  .  acetaminophen (TYLENOL) tablet 650 mg, 650 mg, Oral, Q6H PRN, Evonnie DawesKyere, Belinda K, NP, 650 mg at 08/14/19 0017 .  aspirin EC tablet 81 mg, 81 mg, Oral, Daily, Alwyn RenMathews, Elizabeth G, MD, 81 mg at 08/14/19 [id number] .  enoxaparin (LOVENOX) injection 40 mg, 40 mg, Subcutaneous, Q24H, Alwyn RenMathews, Elizabeth G, MD .  hydrALAZINE (APRESOLINE) injection 5 mg, 5 mg, Intravenous, Q6H PRN, Alwyn RenMathews, Elizabeth G, MD .  LORazepam (ATIVAN) injection 0.5 mg, 0.5 mg, Intravenous, Once, Alwyn RenMathews, Elizabeth G, MD .  sodium chloride flush (NS) 0.9 % injection 3 mL, 3 mL, Intravenous, Once, Milagros Lollykstra, Richard S, MD   Exam: Current vital signs: BP (!) 134/92   Pulse 65   Temp 97.9 F (36.6 C) (Oral)   Resp 18   Ht 5\' 3"  (1.6 m)   Wt 65.8 kg   SpO2 100%   BMI 25.69 kg/m  Vital signs in last 24 hours: Temp:  [97.9 F (36.6 C)-99.2 F (37.3 C)] 97.9 F (36.6 C) (10/28 0825) Pulse Rate:  [56-90] 65 (10/28 0825) Resp:  [14-25] 18 (10/28 0354) BP: (118-148)/(77-93) 134/92 (10/28 0825) SpO2:  [92 %-100 %] 100 % (10/28 0825) Weight:  [65.8 kg] 65.8 kg (10/27 1329)  ROS:     General ROS: negative for - chills, fatigue, fever, night sweats, weight gain or weight loss Psychological ROS: negative for - behavioral disorder, hallucinations, memory difficulties, mood swings or suicidal ideation Ophthalmic ROS: negative for - blurry vision, double vision, eye pain or loss of vision ENT ROS: negative for - epistaxis, nasal discharge, oral lesions, sore throat, tinnitus or vertigo Respiratory ROS: negative for - cough, hemoptysis, shortness of breath or wheezing Cardiovascular ROS: negative for - chest pain, dyspnea on exertion, edema or irregular heartbeat Gastrointestinal ROS: negative for - abdominal  pain, diarrhea, hematemesis, nausea/vomiting or stool incontinence Genito-Urinary ROS: negative for - dysuria, hematuria, incontinence or urinary frequency/urgency Musculoskeletal ROS: negative for - joint swelling or muscular weakness Neurological ROS: as noted in HPI Dermatological ROS: negative for rash and skin lesion changes   Physical Exam   Constitutional: Appears well-developed and well-nourished.  Psych: Affect appropriate to situation Eyes: No scleral injection HENT: No OP obstrucion Head: Normocephalic.  Cardiovascular: Normal rate and regular rhythm.  Respiratory: Effort normal, non-labored breathing GI: Soft.  No distension. There is no tenderness.  Skin: WDI  Neuro: Mental Status: Patient is awake, alert, oriented to person, place, month, year, and situation. Patient is able to give a clear and coherent history. No signs of aphasia or neglect Cranial Nerves: II: Visual Fields are full.  III,IV, VI: EOMI without ptosis or diploplia. Pupils equal, round and reactive to light V: Facial sensation is symmetric to temperature VII: Facial movement is symmetric.  VIII: hearing is intact to voice X: Palat elevates symmetrically XI: Shoulder shrug is symmetric. XII: tongue is midline without atrophy or fasciculations.  Motor: Tone is normal. Bulk is normal. 5/5 strength was present in all four extremities.  Sensory: Sensation is symmetric to light touch and temperature in the arms and legs. Deep Tendon Reflexes: 2+ and symmetric in the biceps and patellae.  Plantars: Toes are downgoing bilaterally.  Cerebellar: FNF and HKS are intact bilaterally  Labs I have reviewed labs in epic and the results pertinent to this consultation are:   CBC    Component Value Date/Time   WBC 7.1 08/13/2019 1328   RBC 4.94 08/13/2019 1328   HGB 14.4 08/13/2019 1328   HCT 44.2 08/13/2019 1328   PLT 261 08/13/2019 1328   MCV 89.5 08/13/2019 1328   MCH 29.1 08/13/2019 1328   MCHC  32.6 08/13/2019 1328   RDW 13.2 08/13/2019 1328    CMP     Component Value Date/Time   NA 140 08/13/2019 1328   K 3.8 08/13/2019 1328   CL 105 08/13/2019 1328   CO2 25 08/13/2019 1328   GLUCOSE 110 (H) 08/13/2019 1328   BUN 8 08/13/2019 1328   CREATININE 0.66 08/13/2019 1328   CALCIUM 9.7 08/13/2019 1328   GFRNONAA >60 08/13/2019 1328   GFRAA >60 08/13/2019 1328    Lipid Panel  No results found for: CHOL, TRIG, HDL, CHOLHDL, VLDL, LDLCALC, LDLDIRECT   Imaging I have reviewed the images obtained:  CT-scan of the brain-no focal acute intracranial abnormality  MRI examination of the brain- no acute findings.  She does have scattered foci of T2 and flair signal in the cerebral hemispheric white matter consistent with chronic small vessel disease.  Etta Quill PA-C [facility] [phone]  M-F  (9:00 am- 5:00 PM)  08/14/2019, 9:07 AM   I have seen the patient and reviewed the above note.   Assessment:  53 year old female with transient right facial numbness in  the setting of elevated blood pressure.  Although with patient's numbness being extremely localized and pain is unusual, still cannot rule out possible TIA given the transient nature.  With fairly significant changes of small vessel disease on MRI, I would favor treating this as TIA.    Recommendations: -MRA of head to evaluated for intracranial atherosclerotic disease, though this could be done as outpatient.  - Goal LDL < 70 - outpatient follow up with neurology.   Ritta Slot, MD [facility] [phone]  If 7pm- 7am, please page neurology on call as listed in AMION.

## 2019-10-01 ENCOUNTER — Ambulatory Visit: Payer: 59 | Admitting: Neurology

## 2021-02-02 ENCOUNTER — Emergency Department (HOSPITAL_COMMUNITY): Payer: No Typology Code available for payment source

## 2021-02-02 ENCOUNTER — Emergency Department (HOSPITAL_COMMUNITY)
Admission: EM | Admit: 2021-02-02 | Discharge: 2021-02-02 | Disposition: A | Discharge status: Home / Self Care | Payer: No Typology Code available for payment source | Attending: Emergency Medicine | Admitting: Emergency Medicine

## 2021-02-02 ENCOUNTER — Encounter (HOSPITAL_COMMUNITY): Payer: Self-pay | Admitting: Emergency Medicine

## 2021-02-02 DIAGNOSIS — W010XXA Fall on same level from slipping, tripping and stumbling without subsequent striking against object, initial encounter: Secondary | ICD-10-CM | POA: Insufficient documentation

## 2021-02-02 DIAGNOSIS — Y9301 Activity, walking, marching and hiking: Secondary | ICD-10-CM | POA: Insufficient documentation

## 2021-02-02 DIAGNOSIS — W19XXXA Unspecified fall, initial encounter: Secondary | ICD-10-CM

## 2021-02-02 DIAGNOSIS — S060X0A Concussion without loss of consciousness, initial encounter: Secondary | ICD-10-CM | POA: Diagnosis not present

## 2021-02-02 DIAGNOSIS — Y9289 Other specified places as the place of occurrence of the external cause: Secondary | ICD-10-CM | POA: Insufficient documentation

## 2021-02-02 DIAGNOSIS — Z8673 Personal history of transient ischemic attack (TIA), and cerebral infarction without residual deficits: Secondary | ICD-10-CM | POA: Diagnosis not present

## 2021-02-02 DIAGNOSIS — S0990XA Unspecified injury of head, initial encounter: Secondary | ICD-10-CM | POA: Diagnosis present

## 2021-02-02 DIAGNOSIS — M545 Low back pain, unspecified: Secondary | ICD-10-CM | POA: Insufficient documentation

## 2021-02-02 NOTE — ED Triage Notes (Signed)
Patient here from home reporting fall this morning. Reports that she slipped and fell going down a ramp trying to walk dog. Reports hitting back of head. Denies LOC. Nausea no vomiting.

## 2021-02-02 NOTE — ED Provider Notes (Signed)
[facility]-EMERGENCY DEPT Provider Note   CSN: [id number] Arrival date & time: 02/02/21  6837     History Chief Complaint  Patient presents with  . Fall  . Head Injury    Susan Abbott is a 55 y.o. female.  Immediately after the incident, she felt slightly confused.  She had some difficulty remembering things.  For instance, she left her car running and did not remember it was running.  Now she is feeling a little bit better but still has pain in the back of her head.  The history is provided by the patient.  Head Injury Location:  Occipital Time since incident:  1 hour Mechanism of injury: fall   Mechanism of injury comment:  Slipped on a frosty ramp and fell backwards striking the back of her head and her lower back Fall:    Fall occurred:  Walking   Height of fall:  >standing height   Impact surface:  Hard floor   Point of impact:  Head Pain details:    Quality: full.   Severity:  Mild (4/10)   Timing:  Constant   Progression:  Unchanged Chronicity:  New Relieved by:  Nothing Worsened by:  Nothing Ineffective treatments:  None tried Associated symptoms: disorientation, headache, memory loss and nausea   Associated symptoms: no blurred vision, no double vision, no focal weakness, no seizures and no vomiting        Past Medical History:  Diagnosis Date  . Anxiety   . GERD (gastroesophageal reflux disease)   . IBS (irritable bowel syndrome)     Patient Active Problem List   Diagnosis Date Noted  . TIA (transient ischemic attack) 08/13/2019  . Facial numbness   . Elevated blood pressure reading   . Dizziness     History reviewed. No pertinent surgical history.   OB History   No obstetric history on file.     Family History  Problem Relation Age of Onset  . Hypertension Mother     Social History   Tobacco Use  . Smoking status: Never Smoker  . Smokeless tobacco: Never Used  Vaping Use  . Vaping Use: Never used   Substance Use Topics  . Alcohol use: Never  . Drug use: Never    Home Medications Prior to Admission medications   Medication Sig Start Date End Date Taking? Authorizing Provider  acetaminophen (TYLENOL) 325 MG tablet Take 650 mg by mouth every 6 (six) hours as needed for mild pain.    [provider]  diazepam (VALIUM) 5 MG tablet Take 5 mg by mouth daily as needed for anxiety.    [provider]  hydrochlorothiazide (HYDRODIURIL) 25 MG tablet Take 25 mg by mouth daily as needed (fluid).     [provider]    Allergies    Doxycycline, Shellfish allergy, and Solu-medrol [methylprednisolone sodium succ]  Review of Systems   Review of Systems  Constitutional: Negative for chills and fever.  HENT: Negative for ear pain and sore throat.   Eyes: Negative for blurred vision, double vision, pain and visual disturbance.  Respiratory: Negative for cough and shortness of breath.   Cardiovascular: Negative for chest pain and palpitations.  Gastrointestinal: Positive for nausea. Negative for abdominal pain and vomiting.  Genitourinary: Negative for dysuria and hematuria.  Musculoskeletal: Negative for arthralgias and back pain.  Skin: Negative for color change and rash.  Neurological: Positive for headaches. Negative for focal weakness, seizures and syncope.  Psychiatric/Behavioral: Positive for  memory loss.  All other systems reviewed and are negative.   Physical Exam Updated Vital Signs BP (!) 162/100 (BP Location: Left Arm)   Pulse 96   Temp 98.3 F (36.8 C) (Oral)   Resp 18   SpO2 100%   Physical Exam Vitals and nursing note reviewed.  HENT:     Head: Normocephalic and atraumatic.     Comments: Moderate tenderness to palpation at the occipital region of the head Eyes:     General: No scleral icterus.    Conjunctiva/sclera: Conjunctivae normal.     Pupils: Pupils are equal, round, and reactive to light.  Pulmonary:     Effort: Pulmonary effort  is normal. No respiratory distress.  Musculoskeletal:        General: Normal range of motion.     Cervical back: Normal range of motion.     Comments: Mild tenderness at bilateral SI joints.  Skin:    General: Skin is warm and dry.  Neurological:     General: No focal deficit present.     Mental Status: She is alert and oriented to person, place, and time.     Cranial Nerves: No cranial nerve deficit.     Sensory: No sensory deficit.     Motor: No weakness.     Coordination: Coordination normal.  Psychiatric:        Mood and Affect: Mood normal.     ED Results / Procedures / Treatments   Labs (all labs ordered are listed, but only abnormal results are displayed) Labs Reviewed - No data to display  EKG None  Radiology DG Lumbar Spine Complete  Result Date: 02/02/2021 CLINICAL DATA:  Fall with lower back pain EXAM: LUMBAR SPINE - COMPLETE 4+ VIEW COMPARISON:  None. FINDINGS: No evidence of fracture or traumatic malalignment. No endplate erosion or incidental bone lesion is seen. Atheromatous calcification of the aorta. IMPRESSION: No acute finding. Electronically Signed   By: Marnee Spring M.D.   On: 02/02/2021 08:03   CT Head Wo Contrast  Result Date: 02/02/2021 CLINICAL DATA:  Fall EXAM: CT HEAD WITHOUT CONTRAST TECHNIQUE: Contiguous axial images were obtained from the base of the skull through the vertex without intravenous contrast. COMPARISON:  August 13, 2019 FINDINGS: Brain: There is no acute intracranial hemorrhage, mass effect, or edema. Gray-white differentiation is preserved. There is no extra-axial fluid collection. Minimal patchy hypoattenuation in the supratentorial white matter is nonspecific but could reflect minor chronic microvascular ischemic changes. Ventricles and sulci are within normal limits in size and configuration. Vascular: No hyperdense vessel or unexpected calcification. Skull: Calvarium is unremarkable. Sinuses/Orbits: No acute finding. Other: None.  IMPRESSION: No evidence of acute intracranial injury. Electronically Signed   By: Guadlupe Spanish M.D.   On: 02/02/2021 08:11    Procedures Procedures   Medications Ordered in ED Medications - No data to display  ED Course  I have reviewed the triage vital signs and the nursing notes.  Pertinent labs & imaging results that were available during my care of the patient were reviewed by me and considered in my medical decision making (see chart for details).    MDM Rules/Calculators/A&P                          Ashlei Chinchilla presents after a fall today.  She hit her head and has likely sustained a concussion.  CT was performed to evaluate for severe injury such as subdural hemorrhage or  other intracranial bleed. Head CT was negative. She will be discharged home with recommendations for close PCP follow-up. Final Clinical Impression(s) / ED Diagnoses Final diagnoses:  Concussion without loss of consciousness, initial encounter  Fall, initial encounter  Acute bilateral low back pain without sciatica    Rx / DC Orders ED Discharge Orders    None       Koleen Distance, MD 02/02/21 [phone]

## 2021-02-02 NOTE — ED Notes (Signed)
Pt waiting to speak with provider before being d/c

## 2021-02-03 ENCOUNTER — Encounter (HOSPITAL_COMMUNITY): Payer: Self-pay

## 2021-02-03 ENCOUNTER — Emergency Department (HOSPITAL_COMMUNITY): Payer: No Typology Code available for payment source

## 2021-02-03 ENCOUNTER — Emergency Department (HOSPITAL_COMMUNITY)
Admission: EM | Admit: 2021-02-03 | Discharge: 2021-02-03 | Disposition: A | Discharge status: Home / Self Care | Payer: No Typology Code available for payment source | Attending: Emergency Medicine | Admitting: Emergency Medicine

## 2021-02-03 DIAGNOSIS — R0789 Other chest pain: Secondary | ICD-10-CM | POA: Diagnosis not present

## 2021-02-03 DIAGNOSIS — I10 Essential (primary) hypertension: Secondary | ICD-10-CM | POA: Insufficient documentation

## 2021-02-03 DIAGNOSIS — R079 Chest pain, unspecified: Secondary | ICD-10-CM

## 2021-02-03 LAB — CBC
HCT: 41.6 % (ref 36.0–46.0)
Hemoglobin: 13.4 g/dL (ref 12.0–15.0)
MCH: 29.3 pg (ref 26.0–34.0)
MCHC: 32.2 g/dL (ref 30.0–36.0)
MCV: 90.8 fL (ref 80.0–100.0)
Platelets: 229 10*3/uL (ref 150–400)
RBC: 4.58 MIL/uL (ref 3.87–5.11)
RDW: 13.3 % (ref 11.5–15.5)
WBC: 9.1 10*3/uL (ref 4.0–10.5)
nRBC: 0 % (ref 0.0–0.2)

## 2021-02-03 LAB — BASIC METABOLIC PANEL
Anion gap: 9 (ref 5–15)
BUN: 6 mg/dL (ref 6–20)
CO2: 22 mmol/L (ref 22–32)
Calcium: 8.8 mg/dL — ABNORMAL LOW (ref 8.9–10.3)
Chloride: 107 mmol/L (ref 98–111)
Creatinine, Ser: 0.69 mg/dL (ref 0.44–1.00)
GFR, Estimated: >60 mL/min (ref >60)
Glucose, Bld: 106 mg/dL — ABNORMAL HIGH (ref 70–99)
Potassium: 3.9 mmol/L (ref 3.5–5.1)
Sodium: 138 mmol/L (ref 135–145)

## 2021-02-03 LAB — HEPATIC FUNCTION PANEL
ALT: 21 U/L (ref 0–44)
AST: 16 U/L (ref 15–41)
Albumin: 3.8 g/dL (ref 3.5–5.0)
Alkaline Phosphatase: 67 U/L (ref 38–126)
Bilirubin, Direct: <0.1 mg/dL (ref 0.0–0.2)
Total Bilirubin: 0.6 mg/dL (ref 0.3–1.2)
Total Protein: 7 g/dL (ref 6.5–8.1)

## 2021-02-03 LAB — I-STAT BETA HCG BLOOD, ED (MC, WL, AP ONLY): I-stat hCG, quantitative: <5 m[IU]/mL (ref <5)

## 2021-02-03 LAB — TROPONIN I (HIGH SENSITIVITY)
Troponin I (High Sensitivity): 3 ng/L (ref <18)
Troponin I (High Sensitivity): 4 ng/L (ref <18)

## 2021-02-03 NOTE — ED Notes (Signed)
Spouse at bedside

## 2021-02-03 NOTE — ED Triage Notes (Signed)
Chest pain, sudden onset, center chest squeezing pain center chest radiating to neck/throat, interm,ittant. Fell yesterday and was seen at [facility] for concussion with soreness to neck. BP at home 199/110, Medic gave nitro sl x 1 with relief of pain, 325 ASA, valium 5 mg with BP 157/93, HR 60's, 02 sat 7% RA, CBG 149.

## 2021-02-03 NOTE — Discharge Instructions (Addendum)
Like we discussed, continue to monitor your symptoms closely.  I have given you a referral to cardiology here in town.  Their contact information is below.  Please give them a call and schedule a follow-up appointment.  If you find your symptoms worsen once again, please return to the emergency department and get reevaluated.  It was a pleasure to meet you.

## 2021-02-03 NOTE — ED Provider Notes (Signed)
[facility] Provider Note   CSN: [id number] Arrival date & time: 02/03/21  [id number]     History Chief Complaint  Patient presents with  . Chest Pain    Susan Abbott is a 55 y.o. female.  HPI Patient is a 55 year old female with a medical history as noted below.  Patient was evaluated yesterday after a fall and had a reassuring CT scan of the head as well as x-ray of the lumbar region.    She woke up this morning and felt normal.  She states about 5 to 10 minutes after waking up she began experiencing constant, central, chest tightness that radiated up to her neck.  She states this lasted for about 10 to 15 minutes.  During this time she states she was nauseated and had some mild shortness of breath.  She does note a history of anxiety and has Valium that she takes as needed.  She states she took a dose of Valium and called EMS.  She was given 1 sublingual nitroglycerin as well as 325 mg of ASA.  She states that her sx were improving prior to being given NTG and ASA.  She states that when she was experiencing this chest tightness that she took her blood pressure which was in the 190 systolic.  She notes a history of hypertension "years ago" but does not take any regular medications for hypertension.  Also notes a history of GERD many years ago but also does not take any regular medications for this.  States that her symptoms do not feel like prior GERD exacerbations.  Patient is a Publishing rights managernurse practitioner.  Denies any current shortness of breath, diaphoresis, nausea, vomiting, leg swelling, hemoptysis, recent travel, recent surgeries, estrogen use, history of blood clot.     Past Medical History:  Diagnosis Date  . Anxiety   . GERD (gastroesophageal reflux disease)   . IBS (irritable bowel syndrome)     Patient Active Problem List   Diagnosis Date Noted  . TIA (transient ischemic attack) 08/13/2019  . Facial numbness   . Elevated blood pressure  reading   . Dizziness     History reviewed. No pertinent surgical history.   OB History   No obstetric history on file.     Family History  Problem Relation Age of Onset  . Hypertension Mother     Social History   Tobacco Use  . Smoking status: Never Smoker  . Smokeless tobacco: Never Used  Vaping Use  . Vaping Use: Never used  Substance Use Topics  . Alcohol use: Never  . Drug use: Never    Home Medications Prior to Admission medications   Medication Sig Start Date End Date Taking? Authorizing Provider  acetaminophen (TYLENOL) 325 MG tablet Take 650 mg by mouth every 6 (six) hours as needed for mild pain.   Yes [provider]  diazepam (VALIUM) 5 MG tablet Take 5 mg by mouth daily as needed for anxiety.   Yes [provider]  hydrochlorothiazide (HYDRODIURIL) 25 MG tablet Take 25 mg by mouth daily as needed (fluid).    Yes [provider]    Allergies    Doxycycline, Shellfish allergy, and Solu-medrol [methylprednisolone sodium succ]  Review of Systems   Review of Systems  All other systems reviewed and are negative. Ten systems reviewed and are negative for acute change, except as noted in the HPI.   Physical Exam Updated Vital Signs BP 118/67   Pulse Marland Kitchen(!)  55   Temp (!) 97.5 F (36.4 C) (Oral)   Resp 15   Ht 5\' 3"  (1.6 m)   Wt 66.2 kg   SpO2 100%   BMI 25.86 kg/m   Physical Exam Vitals and nursing note reviewed.  Constitutional:      General: She is not in acute distress.    Appearance: Normal appearance. She is well-developed and normal weight. She is not ill-appearing, toxic-appearing or diaphoretic.  HENT:     Head: Normocephalic and atraumatic.     Right Ear: External ear normal.     Left Ear: External ear normal.     Nose: Nose normal.     Mouth/Throat:     Mouth: Mucous membranes are moist.     Pharynx: Oropharynx is clear. No oropharyngeal exudate or posterior oropharyngeal erythema.  Eyes:     Extraocular  Movements: Extraocular movements intact.  Cardiovascular:     Rate and Rhythm: Normal rate and regular rhythm.  No extrasystoles are present.    Chest Wall: PMI is not displaced.     Pulses: Normal pulses.          Radial pulses are 2+ on the right side and 2+ on the left side.       Dorsalis pedis pulses are 2+ on the right side and 2+ on the left side.     Heart sounds: Normal heart sounds. Heart sounds not distant. No murmur heard.  No systolic murmur is present.  No diastolic murmur is present. No friction rub. No gallop. No S3 or S4 sounds.   Pulmonary:     Effort: Pulmonary effort is normal. No respiratory distress.     Breath sounds: Normal breath sounds. No stridor. No wheezing, rhonchi or rales.  Abdominal:     General: Abdomen is flat.     Palpations: Abdomen is soft.     Tenderness: There is no abdominal tenderness.  Musculoskeletal:        General: Normal range of motion.     Cervical back: Normal range of motion and neck supple. No tenderness.     Right lower leg: No tenderness. No edema.     Left lower leg: No tenderness. No edema.     Comments: No pedal edema.  2+ DP pulses noted bilaterally.  Skin:    General: Skin is warm and dry.  Neurological:     General: No focal deficit present.     Mental Status: She is alert and oriented to person, place, and time.  Psychiatric:        Mood and Affect: Mood normal.        Behavior: Behavior normal.    ED Results / Procedures / Treatments   Labs (all labs ordered are listed, but only abnormal results are displayed) Labs Reviewed  BASIC METABOLIC PANEL - Abnormal; Notable for the following components:      Result Value   Glucose, Bld 106 (*)    Calcium 8.8 (*)    All other components within normal limits  CBC  HEPATIC FUNCTION PANEL  I-STAT BETA HCG BLOOD, ED ([facility], [facility], [facility] ONLY)  TROPONIN I (HIGH SENSITIVITY)  TROPONIN I (HIGH SENSITIVITY)   EKG EKG Interpretation  Date/Time:  Wednesday February 03 2021 09:08:44  EDT Ventricular Rate:  61 PR Interval:  136 QRS Duration: 94 QT Interval:  411 QTC Calculation: 414 R Axis:   56 Text Interpretation: Sinus rhythm Low voltage, precordial leads Confirmed by [id number] [phone]) on 02/03/2021 9:56:03  AM  Radiology DG Lumbar Spine Complete  Result Date: 02/02/2021 CLINICAL DATA:  Fall with lower back pain EXAM: LUMBAR SPINE - COMPLETE 4+ VIEW COMPARISON:  None. FINDINGS: No evidence of fracture or traumatic malalignment. No endplate erosion or incidental bone lesion is seen. Atheromatous calcification of the aorta. IMPRESSION: No acute finding. Electronically Signed   By: Marnee Spring M.D.   On: 02/02/2021 08:03   CT Head Wo Contrast  Result Date: 02/02/2021 CLINICAL DATA:  Fall EXAM: CT HEAD WITHOUT CONTRAST TECHNIQUE: Contiguous axial images were obtained from the base of the skull through the vertex without intravenous contrast. COMPARISON:  August 13, 2019 FINDINGS: Brain: There is no acute intracranial hemorrhage, mass effect, or edema. Gray-white differentiation is preserved. There is no extra-axial fluid collection. Minimal patchy hypoattenuation in the supratentorial white matter is nonspecific but could reflect minor chronic microvascular ischemic changes. Ventricles and sulci are within normal limits in size and configuration. Vascular: No hyperdense vessel or unexpected calcification. Skull: Calvarium is unremarkable. Sinuses/Orbits: No acute finding. Other: None. IMPRESSION: No evidence of acute intracranial injury. Electronically Signed   By: Guadlupe Spanish M.D.   On: 02/02/2021 08:11   DG Chest Port 1 View  Result Date: 02/03/2021 CLINICAL DATA:  Chest pain EXAM: PORTABLE CHEST 1 VIEW COMPARISON:  None. FINDINGS: Normal heart size and mediastinal contours. No acute infiltrate or edema. No effusion or pneumothorax. No acute osseous findings. Artifact from EKG leads. IMPRESSION: Negative chest. Electronically Signed   By: Marnee Spring M.D.   On:  02/03/2021 10:10   Procedures Procedures   Medications Ordered in ED Medications - No data to display  ED Course  I have reviewed the triage vital signs and the nursing notes.  Pertinent labs & imaging results that were available during my care of the patient were reviewed by me and considered in my medical decision making (see chart for details).    MDM Rules/Calculators/A&P                          Pt is a 55 y.o. female who presents to the ED due to chest pain.  Labs: CBC without abnormalities. BMP with a glucose of 106 and a calcium of 8.8. Hepatic function panel without abnormalities. Troponin of 3 with a repeat of 4.   Imaging: Chest x-ray is negative.  ECG: Sinus rhythm with low voltage.  I, Placido Sou, PA-C, personally reviewed and evaluated these images and lab results as part of my medical decision-making.  Unsure of the cause of her symptoms.  No reproducible chest pain.  She does note a history of anxiety and takes Valium as needed.  She took a dose at the onset of her symptoms which seemed to provide some relief.  She is currently asymptomatic.  She does have a history of GERD but patient does not feel her sx are consistent with prior GERD exacerbations.   Heart score of 3.  Troponin of 3 with a repeat of 4.  Reassuring ECG.  Doubt ACS at this time.  Wells criteria is 0.  Doubt DVT/PE at this time.  Feel that patient is stable for d/c at this time and she is agreeable.  We will give a referral to cardiology.  Urged patient to follow-up with them and get reevaluated.  We discussed return precautions at length.  Feel that patient is stable for discharge at this time and she is agreeable.  Her questions were answered and  she was amicable at the time of discharge.  Note: Portions of this report may have been transcribed using voice recognition software. Every effort was made to ensure accuracy; however, inadvertent computerized transcription errors may be present.    Final Clinical Impression(s) / ED Diagnoses Final diagnoses:  Feeling of chest tightness   Rx / DC Orders ED Discharge Orders    None       Placido Sou, PA-C 02/03/21 1251    Margarita Grizzle, MD 02/04/21 [phone]

## 2021-02-03 NOTE — ED Notes (Signed)
ED Provider at bedside. 

## 2021-02-08 DIAGNOSIS — R072 Precordial pain: Secondary | ICD-10-CM | POA: Insufficient documentation

## 2021-02-08 NOTE — Progress Notes (Signed)
Cardiology Office Note   Date:  02/10/2021   ID:  [name], DOB Aug 13, 1966, MRN [id number]  PCP:  Ignatius Specking, MD  Cardiologist:   No primary care provider on file. Referring:  Ignatius Specking, MD  Chief Complaint  Patient presents with  . Chest Pain      History of Present Illness: Susan Abbott is a 55 y.o. female who presents for evaluation of chest pain.    She was referred by Ignatius Specking, MD  She was in the ED last month with chest pain.  I reviewed these records for this visit.    There was no evidence of ischemia.     She did have an echo in 2020 with NL EF and no significant abnormalities.   This was done when she was hospitalized for TIA.  I did review these records for this visit.  She also had no acute findings on head CT or MRI.  She did not want to have an MRA.  Carotid Dopplers were unremarkable.  She has had panic and anxiety.  She has had some intermittent hypertension.  She gets some swelling and occasionally takes hydrochlorothiazide for this.  She was walking down a dog ramp it was icy and she fell.  She hit her head.  She says she had a concussion.  The next day she had discomfort going from her chin down to her abdomen.  This was unlike anything she had before.  She thought it was potentially musculoskeletal but her blood pressure was 199/113.  She called EMS.  She was given sublingual nitroglycerin and had improvement with systolic down to 734 although it started to go back up in the emergency room.  This was a 6 out of 10 discomfort.  It has since abated.  She was started on metoprolol for the elevated blood pressure.  She does have a history of SVT.  She is also had PVCs.  She says the metoprolol is making her somewhat fatigued but she is tolerating it.  She has been walking routinely with her husband.  She might get some chest discomfort at the end of the walk with typically does well with this.  She did not prior to this had resting chest  discomfort, neck or arm discomfort.  He was not having any presyncope or syncope.  She had no new shortness of breath, PND or orthopnea.  Past Medical History:  Diagnosis Date  . Anxiety   . GERD (gastroesophageal reflux disease)   . IBS (irritable bowel syndrome)     Past Surgical History:  Procedure Laterality Date  . None       Current Outpatient Medications  Medication Sig Dispense Refill  . acetaminophen (TYLENOL) 325 MG tablet Take 650 mg by mouth every 6 (six) hours as needed for mild pain.    . diazepam (VALIUM) 5 MG tablet Take 5 mg by mouth daily as needed for anxiety.    . hydrochlorothiazide (HYDRODIURIL) 25 MG tablet Take 25 mg by mouth daily as needed (fluid).     . metoprolol succinate (TOPROL-XL) 25 MG 24 hr tablet Take 1 tablet by mouth daily.     No current facility-administered medications for this visit.    Allergies:   Doxycycline, Shellfish allergy, and Solu-medrol [methylprednisolone sodium succ]    Social History:  The patient  reports that she has never smoked. She has never used smokeless tobacco. She reports that she does not drink alcohol and  does not use drugs.   Family History:  The patient's family history includes Cancer in her mother; Cirrhosis in her father; Emphysema in her father; Hypertension in her mother.    ROS:  Please see the history of present illness.   Otherwise, review of systems are positive for none.   All other systems are reviewed and negative.    PHYSICAL EXAM: VS:  BP (!) 148/98   Pulse 68   Ht 5\' 3"  (1.6 m)   Wt 150 lb (68 kg)   BMI 26.57 kg/m  , BMI Body mass index is 26.57 kg/m. GENERAL:  Well appearing HEENT:  Pupils equal round and reactive, fundi not visualized, oral mucosa unremarkable NECK:  No jugular venous distention, waveform within normal limits, carotid upstroke brisk and symmetric, no bruits, no thyromegaly LYMPHATICS:  No cervical, inguinal adenopathy LUNGS:  Clear to auscultation bilaterally BACK:   No CVA tenderness CHEST:  Unremarkable HEART:  PMI not displaced or sustained,S1 and S2 within normal limits, no S3, no S4, no clicks, no rubs, no murmurs ABD:  Flat, positive bowel sounds normal in frequency in pitch, no bruits, no rebound, no guarding, no midline pulsatile mass, no hepatomegaly, no splenomegaly EXT:  2 plus pulses throughout, no edema, no cyanosis no clubbing SKIN:  No rashes no nodules NEURO:  Cranial nerves II through XII grossly intact, motor grossly intact throughout PSYCH:  Cognitively intact, oriented to person place and time    EKG:  EKG is not ordered today. The ekg ordered 02/03/2021 demonstrates sinus rhythm, rate 61, axis within normal limits, intervals within normal limits, no acute ST-T wave.   Recent Labs: 02/03/2021: ALT 21; BUN 6; Creatinine, Ser 0.69; Hemoglobin 13.4; Platelets 229; Potassium 3.9; Sodium 138    Lipid Panel No results found for: CHOL, TRIG, HDL, CHOLHDL, VLDL, LDLCALC, LDLDIRECT    Wt Readings from Last 3 Encounters:  02/10/21 150 lb (68 kg)  02/03/21 146 lb (66.2 kg)  08/13/19 145 lb (65.8 kg)      Other studies Reviewed: Additional studies/ records that were reviewed today include: ED records and labs. Review of the above records demonstrates:  Please see elsewhere in the note.     ASSESSMENT AND PLAN:  PRECORDIAL CHEST PAIN: This is atypical and very likely nonanginal.  I think a coronary calcium score would be helpful as she is tells me she has some dyslipidemia and this would guide 08/15/19 in further testing as well as goals of therapy for her cholesterol.  HTN: Looking back in multiple readings she definitely has high blood pressure.  I think the metoprolol is a reasonable choice given her previous arrhythmias.  We talked about titrating this further.  We talked about other alternatives and she will let me know.  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   Current medicines are reviewed at length with the  patient today.  The patient has concerns regarding medicines.  The following changes have been made:  no change  Labs/ tests ordered today include:   Orders Placed This Encounter  Procedures  . CT CARDIAC SCORING (SELF PAY ONLY)     Disposition:   FU with me as needed based on symptoms.   Signed, [place], MD  02/10/2021 10:46 AM    [facility]

## 2021-02-10 ENCOUNTER — Ambulatory Visit: Payer: No Typology Code available for payment source | Admitting: Cardiology

## 2021-02-10 ENCOUNTER — Other Ambulatory Visit: Payer: Self-pay

## 2021-02-10 ENCOUNTER — Encounter: Payer: Self-pay | Admitting: Cardiology

## 2021-02-10 VITALS — BP 148/98 | HR 68 | Ht 63.0 in | Wt 150.0 lb

## 2021-02-10 DIAGNOSIS — R072 Precordial pain: Secondary | ICD-10-CM

## 2021-02-10 NOTE — Patient Instructions (Signed)
Medication Instructions:  The current medical regimen is effective;  continue present plan and medications.  *If you need a refill on your cardiac medications before your next appointment, please call your pharmacy*  Testing/Procedures: Your physician has requested that you have Coronary Calcium Score which is completed by CT. Cardiac computed tomography (CT) is a painless test that uses an x-ray machine to take clear, detailed pictures of your heart.  There are no instructions for this testing.  You may eat/drink as normal this day.  The testing is completed at [address], [place], [place].  The cost is $99 due at the time of the testing.  You will be contacted to be schedule.  Follow-Up: At [facility], you and your health needs are our priority.  As part of our continuing mission to provide you with exceptional heart care, we have created designated Provider Care Teams.  These Care Teams include your primary Cardiologist (physician) and Advanced Practice Providers (APPs -  Physician Assistants and Nurse Practitioners) who all work together to provide you with the care you need, when you need it.  We recommend signing up for the patient portal called "MyChart".  Sign up information is provided on this After Visit Summary.  MyChart is used to connect with patients for Virtual Visits (Telemedicine).  Patients are able to view lab/test results, encounter notes, upcoming appointments, etc.  Non-urgent messages can be sent to your provider as well.   To learn more about what you can do with MyChart, go to [email or web address].    Your next appointment:   Follow up will be determined based on results of the above testing.  Thank you for choosing [facility]!!

## 2021-03-18 ENCOUNTER — Inpatient Hospital Stay: Admission: RE | Admit: 2021-03-18 | Payer: Self-pay | Source: Ambulatory Visit

## 2021-03-29 ENCOUNTER — Ambulatory Visit (INDEPENDENT_AMBULATORY_CARE_PROVIDER_SITE_OTHER)
Admission: RE | Admit: 2021-03-29 | Discharge: 2021-03-29 | Disposition: A | Discharge status: Home / Self Care | Payer: Self-pay | Source: Ambulatory Visit | Attending: Cardiology | Admitting: Cardiology

## 2021-03-29 ENCOUNTER — Other Ambulatory Visit: Payer: Self-pay

## 2021-03-29 DIAGNOSIS — R072 Precordial pain: Secondary | ICD-10-CM

## 2021-04-06 ENCOUNTER — Telehealth: Payer: Self-pay | Admitting: *Deleted

## 2021-04-06 DIAGNOSIS — R931 Abnormal findings on diagnostic imaging of heart and coronary circulation: Secondary | ICD-10-CM

## 2021-04-06 DIAGNOSIS — R072 Precordial pain: Secondary | ICD-10-CM

## 2021-04-06 NOTE — Telephone Encounter (Signed)
-----   Message from Rollene Rotunda, MD sent at 04/05/2021 10:10 AM EDT ----- She does have elevated coronary calcium.  I would like for her to have a POET (Plain Old Exercise Treadmill)  ----- Message ----- From: Interface, Rad Results In Sent: 03/29/2021  10:01 AM EDT To: Rollene Rotunda, MD

## 2021-04-07 NOTE — Telephone Encounter (Signed)
Spoke with pt and reviewed Coronary Ca results and need for GXT.  She would prefer to come to [facility] to have the stress testing.  Advised order will be placed and she will be called to be scheduled.    Reviewed instructions prior to GXT including to hold her Metoprolol the AM of the testing.  No food/drink 3 hours before, come prepared to exercise in 2 piece comfortable clothing.  She states understanding.

## 2021-04-07 NOTE — Telephone Encounter (Signed)
    Pt is returning call, pt said to call her cell# if she didn't answer today or tomorrow on her cell# call on Friday its her off and call her on her home#

## 2021-04-22 ENCOUNTER — Other Ambulatory Visit: Payer: Self-pay

## 2021-04-22 ENCOUNTER — Ambulatory Visit (INDEPENDENT_AMBULATORY_CARE_PROVIDER_SITE_OTHER): Payer: No Typology Code available for payment source

## 2021-04-22 DIAGNOSIS — R072 Precordial pain: Secondary | ICD-10-CM

## 2021-04-22 DIAGNOSIS — R931 Abnormal findings on diagnostic imaging of heart and coronary circulation: Secondary | ICD-10-CM

## 2021-04-22 LAB — EXERCISE TOLERANCE TEST
Estimated workload: 7.2 METS
Exercise duration (min): 6 min
Exercise duration (sec): 11 s
MPHR: 165 {beats}/min
Peak HR: 142 {beats}/min
Percent HR: 86 %
RPE: 17
Rest HR: 72 {beats}/min

## 2021-04-26 ENCOUNTER — Telehealth: Payer: Self-pay | Admitting: Cardiology

## 2021-04-26 ENCOUNTER — Other Ambulatory Visit: Payer: Self-pay

## 2021-04-26 DIAGNOSIS — I259 Chronic ischemic heart disease, unspecified: Secondary | ICD-10-CM

## 2021-04-26 DIAGNOSIS — R931 Abnormal findings on diagnostic imaging of heart and coronary circulation: Secondary | ICD-10-CM

## 2021-04-26 DIAGNOSIS — Z01810 Encounter for preprocedural cardiovascular examination: Secondary | ICD-10-CM

## 2021-04-26 NOTE — Telephone Encounter (Signed)
[name] is calling requesting a nurse call her with her ETT results. Please advise.

## 2021-04-26 NOTE — Telephone Encounter (Signed)
Rollene Rotunda, MD  04/23/2021 12:44 PM EDT      Given the equivocal stress test I would like for her to have a coronary CT angiogram.  Call Ms. Winokur with the results and send results to Doreen Beam B,MD    Pt agrees to having a cardiac CT and verbalized understanding of her instructions. Will send to her My Chart as well. PT is very reluctant to take the Lopressor 100 mg dur to already low HR in the 50's with her Toprol..She will go ahead and her take her normal Toprol dose that morning.   She will have BMET prior to her CT after she gets her date.

## 2021-05-05 ENCOUNTER — Ambulatory Visit (HOSPITAL_COMMUNITY): Payer: No Typology Code available for payment source

## 2021-05-05 ENCOUNTER — Other Ambulatory Visit: Payer: Self-pay

## 2021-05-05 DIAGNOSIS — Z01810 Encounter for preprocedural cardiovascular examination: Secondary | ICD-10-CM

## 2021-05-05 LAB — BASIC METABOLIC PANEL
BUN/Creatinine Ratio: 13 (ref 9–23)
BUN: 9 mg/dL (ref 6–24)
CO2: 21 mmol/L (ref 20–29)
Calcium: 9.4 mg/dL (ref 8.7–10.2)
Chloride: 101 mmol/L (ref 96–106)
Creatinine, Ser: 0.7 mg/dL (ref 0.57–1.00)
Glucose: 97 mg/dL (ref 65–99)
Potassium: 4.3 mmol/L (ref 3.5–5.2)
Sodium: 137 mmol/L (ref 134–144)
eGFR: 102 mL/min/{1.73_m2} (ref >59)

## 2021-05-06 ENCOUNTER — Telehealth (HOSPITAL_COMMUNITY): Payer: Self-pay | Admitting: Emergency Medicine

## 2021-05-06 NOTE — Telephone Encounter (Signed)
Attempted to call patient regarding upcoming cardiac CT appointment. °Left message on voicemail with name and callback number °Zeev Deakins RN Navigator Cardiac Imaging °[facility] °[phone] Office °[phone] Cell ° °

## 2021-05-07 ENCOUNTER — Encounter (HOSPITAL_COMMUNITY): Payer: Self-pay

## 2021-05-07 ENCOUNTER — Ambulatory Visit (HOSPITAL_COMMUNITY)
Admission: RE | Admit: 2021-05-07 | Discharge: 2021-05-07 | Disposition: A | Discharge status: Home / Self Care | Payer: No Typology Code available for payment source | Source: Ambulatory Visit | Attending: Cardiology | Admitting: Cardiology

## 2021-05-07 ENCOUNTER — Other Ambulatory Visit: Payer: Self-pay

## 2021-05-07 ENCOUNTER — Telehealth (HOSPITAL_COMMUNITY): Payer: Self-pay | Admitting: Emergency Medicine

## 2021-05-07 DIAGNOSIS — I259 Chronic ischemic heart disease, unspecified: Secondary | ICD-10-CM | POA: Diagnosis not present

## 2021-05-07 DIAGNOSIS — R931 Abnormal findings on diagnostic imaging of heart and coronary circulation: Secondary | ICD-10-CM | POA: Insufficient documentation

## 2021-05-07 MED ORDER — NITROGLYCERIN 0.4 MG SL SUBL: 0.8000 mg | SUBLINGUAL_TABLET | Freq: Once | SUBLINGUAL | Status: AC

## 2021-05-07 MED ORDER — NITROGLYCERIN 0.4 MG SL SUBL
SUBLINGUAL_TABLET | SUBLINGUAL | Status: AC
  Administered 2021-05-07: 0.8 mg via SUBLINGUAL
  Filled 2021-05-07: qty 2

## 2021-05-07 MED ORDER — IOHEXOL 350 MG/ML SOLN
80.0000 mL | Freq: Once | INTRAVENOUS | Status: AC | PRN
  Administered 2021-05-07: 80 mL via INTRAVENOUS

## 2021-05-07 NOTE — Telephone Encounter (Signed)
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 I exaplined that those with shellfish allergies are not necessarily allergic to contrast.   Pt PRN valium and I encouraged her to take this in the event that her anxiety affects her HR for the scan. She states her husband is driving her today. (At the least, bring the valium with her to the appt and gauge how she fells on arrival)  Pt appreciated the information Susan Abbott

## 2021-05-18 ENCOUNTER — Telehealth: Payer: Self-pay

## 2021-05-18 NOTE — Telephone Encounter (Signed)
Called patient with the updated information/recommendation and to try and schedule her for a 3 month appointment as suggested. Patient is not happy that her results were sent via MyChart and not called to have them discussed. Tried to reviewed with her the MyChart recommendations and patient still would like for someone to call her because she stated that she knows how to read but she is not a cardiologist. 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 I informed her that someone will give her a call tomorrow if possible.

## 2021-05-18 NOTE — Telephone Encounter (Signed)
-----   Message from Rollene Rotunda, MD sent at 05/15/2021  3:30 PM EDT ----- I agree with suggestions per Dr. Royann Shivers and I appreciate his help reviewing this in my absence.  Given the abnormal results along with some hypertension and dyslipidemia would like to see patient back in 3 months for further work on primary risk reduction.  Call Ms. Portier with the results and send results to Ignatius Specking, MD

## 2021-08-31 DIAGNOSIS — I10 Essential (primary) hypertension: Secondary | ICD-10-CM | POA: Insufficient documentation

## 2021-08-31 DIAGNOSIS — R931 Abnormal findings on diagnostic imaging of heart and coronary circulation: Secondary | ICD-10-CM | POA: Insufficient documentation

## 2021-08-31 NOTE — Progress Notes (Signed)
Cardiology Office Note   Date:  09/01/2021   ID:  Susan Abbott, DOB 1966/06/15, MRN [id number]  PCP:  Ignatius Specking, MD  Cardiologist:   None Referring:  Ignatius Specking, MD  Chief Complaint  Patient presents with   Chest Pain       History of Present Illness: Susan Abbott is a 55 y.o. female who presents for evaluation of chest pain.    She was referred by Ignatius Specking, MD  She was in the ED earlier this year with chest pain.  There was no objective evidence of ischemia.  She did have an echo in 2020 with NL EF and no significant abnormalities.   This was done when she was hospitalized for TIA.  I did review these records for this visit.  She also had no acute findings on head CT or MRI.  She did not want to have an MRA.  Carotid Dopplers were unremarkable.    When I saw her in July she had chest pain.  I sent her for a coronary CT.  It was suggested that she have aggressive medical management and risk reduction.  Her calcium score was 249 which was 98th percentile but she had nonobstructive plaque with only 0 to 24% scattered stenoses.  She returns for follow-up.  She is sporadically getting chest discomfort but its not reproducible.  She is not having any new shortness of breath, PND or orthopnea.  She does not have any new palpitations although she will occasionally feel these.  She says the chest discomfort that she does have is likely precipitated by stress.    Past Medical History:  Diagnosis Date   Anxiety    GERD (gastroesophageal reflux disease)    IBS (irritable bowel syndrome)     Past Surgical History:  Procedure Laterality Date   None       Current Outpatient Medications  Medication Sig Dispense Refill   acetaminophen (TYLENOL) 325 MG tablet Take 650 mg by mouth every 6 (six) hours as needed for mild pain.     diazepam (VALIUM) 5 MG tablet Take 5 mg by mouth daily as needed for anxiety.     metoprolol succinate (TOPROL-XL) 25 MG 24 hr tablet  Take 1 tablet by mouth daily.     No current facility-administered medications for this visit.    Allergies:   Doxycycline, Shellfish allergy, and Solu-medrol [methylprednisolone sodium succ]    ROS:  Please see the history of present illness.   Otherwise, review of systems are positive for none.   All other systems are reviewed and negative.    PHYSICAL EXAM: VS:  BP 138/88   Pulse 61   Ht 5\' 3"  (1.6 m)   Wt 143 lb (64.9 kg)   BMI 25.33 kg/m  , BMI Body mass index is 25.33 kg/m. GENERAL:  Well appearing NECK:  No jugular venous distention, waveform within normal limits, carotid upstroke brisk and symmetric, no bruits, no thyromegaly LUNGS:  Clear to auscultation bilaterally CHEST:  Unremarkable HEART:  PMI not displaced or sustained,S1 and S2 within normal limits, no S3, no S4, no clicks, no rubs, no murmurs ABD:  Flat, positive bowel sounds normal in frequency in pitch, no bruits, no rebound, no guarding, no midline pulsatile mass, no hepatomegaly, no splenomegaly EXT:  2 plus pulses throughout, no edema, no cyanosis no clubbing   EKG:  EKG is  ordered today. Sinus rhythm, rate 69, axis within normal limits, intervals  within normal limits, no acute T wave changes.   Recent Labs: 02/03/2021: ALT 21; Hemoglobin 13.4; Platelets 229 05/05/2021: BUN 9; Creatinine, Ser 0.70; Potassium 4.3; Sodium 137    Lipid Panel No results found for: CHOL, TRIG, HDL, CHOLHDL, VLDL, LDLCALC, LDLDIRECT    Wt Readings from Last 3 Encounters:  09/01/21 143 lb (64.9 kg)  02/10/21 150 lb (68 kg)  02/03/21 146 lb (66.2 kg)      Other studies Reviewed: Additional studies/ records that were reviewed today include: CT Review of the above records demonstrates:  Please see elsewhere in the note.     ASSESSMENT AND PLAN:  PRECORDIAL CHEST PAIN: There is no evidence of obstructive CAD.     There has been no objective evidence of ischemia.  We talked at length about the need for significant risk  reduction given her burden of plaque at her age.  This is addressed below.    HTN: Her blood pressure is upper limits of acceptable but she does not want to consider higher dose or additional blood pressure medications.   DYSLIPIDEMIA: I do not have the most recent labs but she says that her cholesterol is above 200.  Her LDL is not at target.  She absolutely does not want to take a statin which I would suggest.  She has been eating lots of meat protein and we discussed this.  I at least suggested to her at the minimum she should consider a plant-based diet.  Current medicines are reviewed at length with the patient today.  The patient has concerns regarding medicines.  The following changes have been made: None  Labs/ tests ordered today include: None  Orders Placed This Encounter  Procedures   EKG 12-Lead      Disposition:   FU with me 1 year   Signed, Rollene Rotunda, MD  09/01/2021 4:17 PM    [facility]

## 2021-09-01 ENCOUNTER — Encounter: Payer: Self-pay | Admitting: Cardiology

## 2021-09-01 ENCOUNTER — Ambulatory Visit (INDEPENDENT_AMBULATORY_CARE_PROVIDER_SITE_OTHER): Payer: No Typology Code available for payment source | Admitting: Cardiology

## 2021-09-01 ENCOUNTER — Other Ambulatory Visit: Payer: Self-pay

## 2021-09-01 VITALS — BP 138/88 | HR 61 | Ht 63.0 in | Wt 143.0 lb

## 2021-09-01 DIAGNOSIS — R931 Abnormal findings on diagnostic imaging of heart and coronary circulation: Secondary | ICD-10-CM | POA: Diagnosis not present

## 2021-09-01 DIAGNOSIS — R072 Precordial pain: Secondary | ICD-10-CM | POA: Diagnosis not present

## 2021-09-01 DIAGNOSIS — I10 Essential (primary) hypertension: Secondary | ICD-10-CM | POA: Diagnosis not present

## 2021-09-01 NOTE — Patient Instructions (Signed)
Medication Instructions:  The current medical regimen is effective;  continue present plan and medications.  *If you need a refill on your cardiac medications before your next appointment, please call your pharmacy*  Follow-Up: At [facility], you and your health needs are our priority.  As part of our continuing mission to provide you with exceptional heart care, we have created designated Provider Care Teams.  These Care Teams include your primary Cardiologist (physician) and Advanced Practice Providers (APPs -  Physician Assistants and Nurse Practitioners) who all work together to provide you with the care you need, when you need it.  We recommend signing up for the patient portal called "MyChart".  Sign up information is provided on this After Visit Summary.  MyChart is used to connect with patients for Virtual Visits (Telemedicine).  Patients are able to view lab/test results, encounter notes, upcoming appointments, etc.  Non-urgent messages can be sent to your provider as well.   To learn more about what you can do with MyChart, go to [email or web address].    Your next appointment:   1 year(s)  The format for your next appointment:   In Person  Provider:   James Hochrein, MD   Thank you for choosing [facility]!!    

## 2022-05-20 IMAGING — CT CT HEART MORP W/ CTA COR W/ SCORE W/ CA W/CM &/OR W/O CM
4 of 7 series · 8 of 20 positions shown, 9 images · IV contrast (APPLIED)
Comparison: None.
COMPARISON: None.

Addendum:
EXAM:
OVER-READ INTERPRETATION  CT CHEST

The following report is an over-read performed by radiologist Dr.
Kringsly Kalei [REDACTED] on 05/07/2021. This
over-read does not include interpretation of cardiac or coronary
anatomy or pathology. The coronary calcium score/coronary CTA
interpretation by the cardiologist is attached.
CLINICAL DATA: 54 year old with chest pain
Cardiac/Coronary  CTA
TECHNIQUE: The patient was scanned on a [employer] Force scanner.

[Series 6: best diast 77 % · axial · 0.39mm/px · z∈[-307,-262]mm · 2 of 338 slices shown]
[im 113/338  vessel]
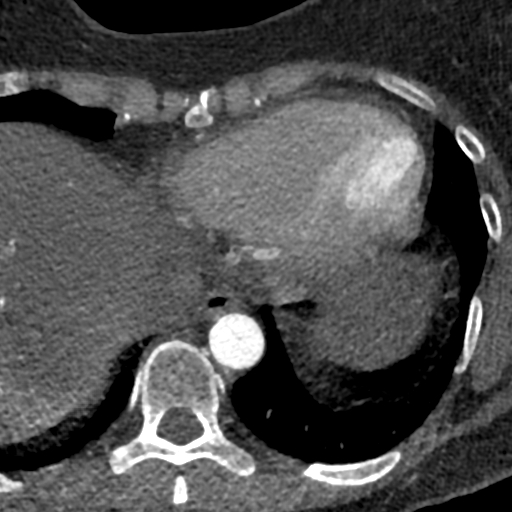
[im 225/338  vessel]
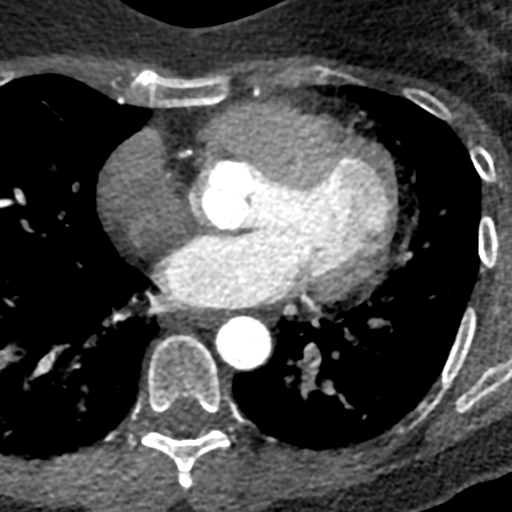

[Series 7: best syst · axial · 0.39mm/px · z∈[-307,-262]mm · 2 of 338 slices shown, 3 images]
[im 113/338  vessel]
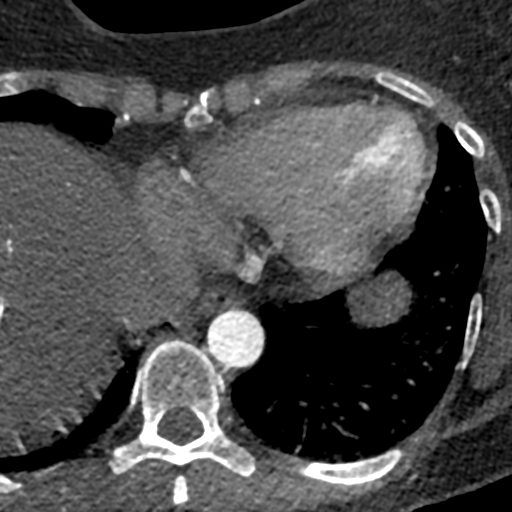
[im 113/338  lung]
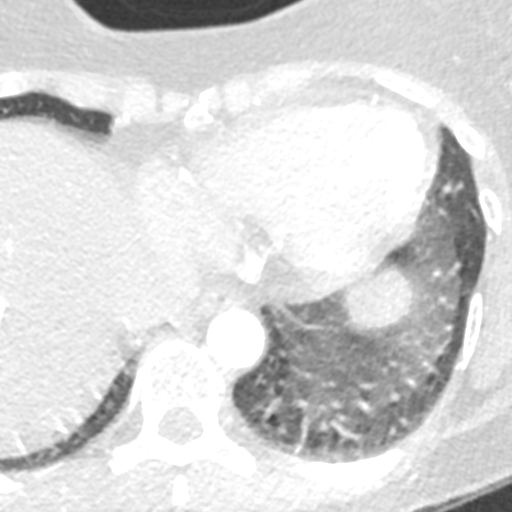
[im 225/338  vessel]
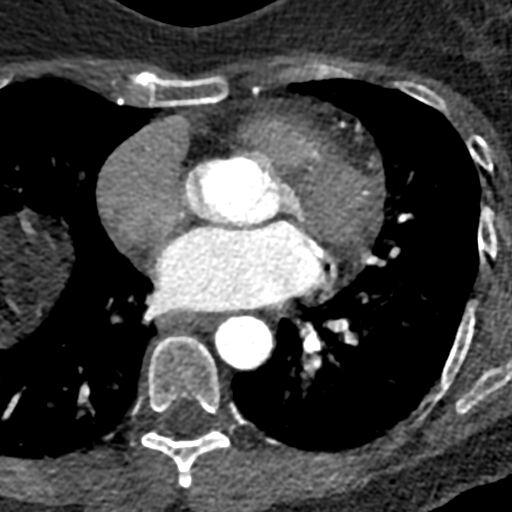

[Series 8: ts diast sharp 77 % · axial · 0.39mm/px · z∈[-307,-262]mm · 2 of 338 slices shown]
[im 113/338  lung]
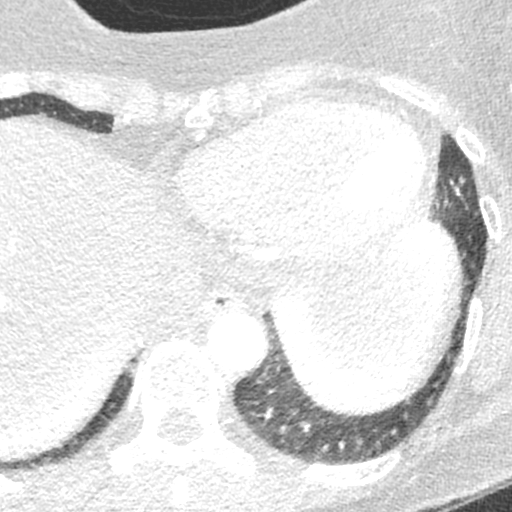
[im 225/338  lung]
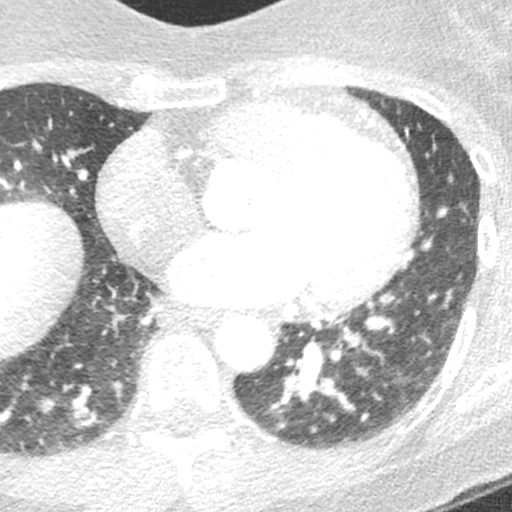

[Series 9: ts syst sharp 38 % · axial · 0.39mm/px · z∈[-307,-262]mm · 2 of 338 slices shown]
[im 113/338  lung]
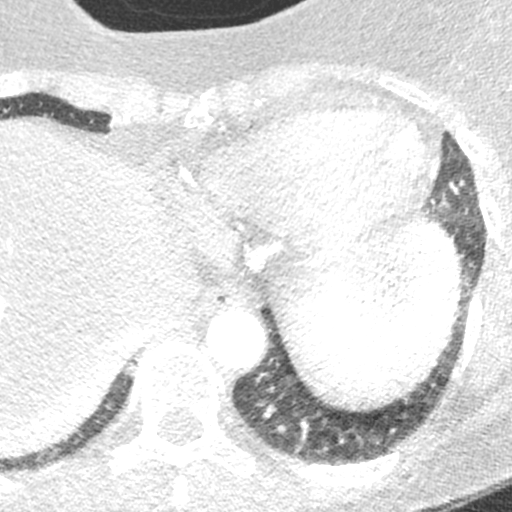
[im 225/338  lung]
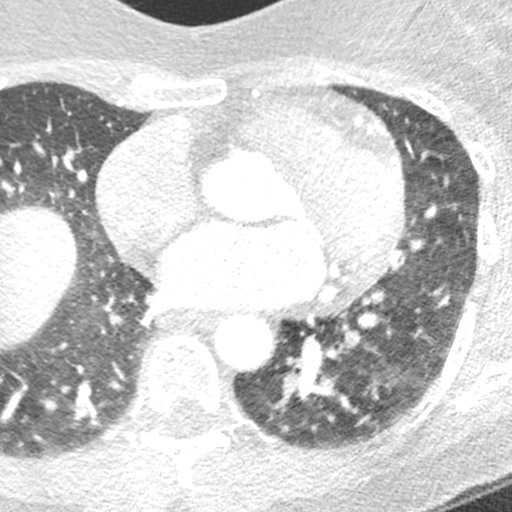

[8 of 20 positions shown; findings below may reference images not displayed]

FINDINGS: Atherosclerotic calcifications in the thoracic aorta. Within the
visualized portions of the thorax there are no suspicious appearing
pulmonary nodules or masses, there is no acute consolidative
airspace disease, no pleural effusions, no pneumothorax and no
lymphadenopathy. Visualized portions of the upper abdomen are
unremarkable. There are no aggressive appearing lytic or blastic
lesions noted in the visualized portions of the skeleton.
IMPRESSION: 1.  Aortic Atherosclerosis (6IFY8-HZZ.Z).
FINDINGS: A 100 kV prospective scan was triggered in the descending thoracic
aorta at 111 HU's. Axial non-contrast 3 mm slices were carried out
through the heart. The data set was analyzed on a dedicated work
station and scored using the Agatson method. Gantry rotation speed
was 250 msecs and collimation was .6 mm. Beta blockade and 0.8 mg of
sl NTG was given. The 3D data set was reconstructed in 5% intervals
of the 67-82 % of the R-R cycle. Diastolic phases were analyzed on a
dedicated work station using MPR, MIP and VRT modes. The patient
received 80 cc of contrast.

There is significant stair step artifact.

Aorta: Normal size. Mild aortic annular calcifications. No
dissection.

Aortic Valve: No calcifications.

Coronary Arteries:  Normal coronary origin.  Right dominance.

RCA is a large dominant artery that gives rise to PDA and PLA. There
is scattered calcified plaque, 0-24% stenosis. Distal vessel/ PDA is
poorly visualized.

Left main is a large artery that gives rise to LAD and LCX arteries.

LAD is a large vessel with spotty calcification plaque, 0-24%
stenosis, both proximal and mid calcified plaque at the second
diagonal branch 0-24% stenosis.

LCX is a non-dominant artery that gives rise to one moderate sized
OM1 branch. There is no plaque in segments visualized. There is
significant stair step artifact affecting visualization of mid
circumflex.

Calcium score:

LM: 0

LAD: 96

LCX: 99

RCA: 55

Total: 249

Percentile: 98%

Other findings:

Normal pulmonary vein drainage into the left atrium.

Normal left atrial appendage without a thrombus.

Normal size of the pulmonary artery.

Please see radiology report for non cardiac findings.
IMPRESSION: 1. Coronary calcium score of 249. This was 98 percentile for age and
sex matched control.

2. Normal coronary origin with right dominance.

3. There is scattered multivessel calcified plaque that within
segments visualized appears non flow limiting (0-24% stenosis).

*** End of Addendum ***
EXAM:
OVER-READ INTERPRETATION  CT CHEST

The following report is an over-read performed by radiologist Dr.
Kringsly Kalei [REDACTED] on 05/07/2021. This
over-read does not include interpretation of cardiac or coronary
anatomy or pathology. The coronary calcium score/coronary CTA
interpretation by the cardiologist is attached.
FINDINGS: Atherosclerotic calcifications in the thoracic aorta. Within the
visualized portions of the thorax there are no suspicious appearing
pulmonary nodules or masses, there is no acute consolidative
airspace disease, no pleural effusions, no pneumothorax and no
lymphadenopathy. Visualized portions of the upper abdomen are
unremarkable. There are no aggressive appearing lytic or blastic
lesions noted in the visualized portions of the skeleton.
IMPRESSION: 1.  Aortic Atherosclerosis (6IFY8-HZZ.Z).

## 2023-09-10 ENCOUNTER — Emergency Department (HOSPITAL_BASED_OUTPATIENT_CLINIC_OR_DEPARTMENT_OTHER): Payer: Commercial Managed Care - PPO

## 2023-09-10 ENCOUNTER — Encounter (HOSPITAL_BASED_OUTPATIENT_CLINIC_OR_DEPARTMENT_OTHER): Payer: Self-pay

## 2023-09-10 ENCOUNTER — Other Ambulatory Visit: Payer: Self-pay

## 2023-09-10 ENCOUNTER — Emergency Department (HOSPITAL_BASED_OUTPATIENT_CLINIC_OR_DEPARTMENT_OTHER)
Admission: EM | Admit: 2023-09-10 | Discharge: 2023-09-10 | Disposition: A | Discharge status: Home / Self Care | Payer: Commercial Managed Care - PPO | Attending: Emergency Medicine | Admitting: Emergency Medicine

## 2023-09-10 DIAGNOSIS — R109 Unspecified abdominal pain: Secondary | ICD-10-CM | POA: Diagnosis present

## 2023-09-10 DIAGNOSIS — K5732 Diverticulitis of large intestine without perforation or abscess without bleeding: Secondary | ICD-10-CM | POA: Diagnosis not present

## 2023-09-10 DIAGNOSIS — K5792 Diverticulitis of intestine, part unspecified, without perforation or abscess without bleeding: Secondary | ICD-10-CM

## 2023-09-10 LAB — CBC
HCT: 40.7 % (ref 36.0–46.0)
Hemoglobin: 13.9 g/dL (ref 12.0–15.0)
MCH: 30.1 pg (ref 26.0–34.0)
MCHC: 34.2 g/dL (ref 30.0–36.0)
MCV: 88.1 fL (ref 80.0–100.0)
Platelets: 214 10*3/uL (ref 150–400)
RBC: 4.62 MIL/uL (ref 3.87–5.11)
RDW: 12.9 % (ref 11.5–15.5)
WBC: 11.4 10*3/uL — ABNORMAL HIGH (ref 4.0–10.5)
nRBC: 0 % (ref 0.0–0.2)

## 2023-09-10 LAB — COMPREHENSIVE METABOLIC PANEL
ALT: 21 U/L (ref 0–44)
AST: 18 U/L (ref 15–41)
Albumin: 3.9 g/dL (ref 3.5–5.0)
Alkaline Phosphatase: 74 U/L (ref 38–126)
Anion gap: 10 (ref 5–15)
BUN: 7 mg/dL (ref 6–20)
CO2: 21 mmol/L — ABNORMAL LOW (ref 22–32)
Calcium: 8.9 mg/dL (ref 8.9–10.3)
Chloride: 104 mmol/L (ref 98–111)
Creatinine, Ser: 0.71 mg/dL (ref 0.44–1.00)
GFR, Estimated: >60 mL/min (ref >60)
Glucose, Bld: 105 mg/dL — ABNORMAL HIGH (ref 70–99)
Potassium: 3.6 mmol/L (ref 3.5–5.1)
Sodium: 135 mmol/L (ref 135–145)
Total Bilirubin: 0.8 mg/dL (ref <1.2)
Total Protein: 7.8 g/dL (ref 6.5–8.1)

## 2023-09-10 LAB — URINALYSIS, ROUTINE W REFLEX MICROSCOPIC
Bilirubin Urine: NEGATIVE
Glucose, UA: NEGATIVE mg/dL
Hgb urine dipstick: NEGATIVE
Ketones, ur: NEGATIVE mg/dL
Leukocytes,Ua: NEGATIVE
Nitrite: NEGATIVE
Protein, ur: NEGATIVE mg/dL
Specific Gravity, Urine: <=1.005 (ref 1.005–1.030)
pH: 6.5 (ref 5.0–8.0)

## 2023-09-10 LAB — LIPASE, BLOOD: Lipase: 30 U/L (ref 11–51)

## 2023-09-10 LAB — TROPONIN I (HIGH SENSITIVITY): Troponin I (High Sensitivity): <2 ng/L (ref <18)

## 2023-09-10 MED ORDER — FLUCONAZOLE 150 MG PO TABS: 150.0000 mg | ORAL_TABLET | Freq: Once | ORAL | 0 refills | Status: AC

## 2023-09-10 MED ORDER — FAMOTIDINE IN NACL 20-0.9 MG/50ML-% IV SOLN: 20.0000 mg | Freq: Once | INTRAVENOUS | Status: DC

## 2023-09-10 MED ORDER — ALIGN 4 MG PO CAPS: 1.0000 | ORAL_CAPSULE | Freq: Every day | ORAL | 0 refills | Status: AC

## 2023-09-10 MED ORDER — IOHEXOL 350 MG/ML SOLN
100.0000 mL | Freq: Once | INTRAVENOUS | Status: AC | PRN
  Administered 2023-09-10: 100 mL via INTRAVENOUS

## 2023-09-10 MED ORDER — SODIUM CHLORIDE 0.9 % IV BOLUS: 1000.0000 mL | Freq: Once | INTRAVENOUS | Status: DC

## 2023-09-10 MED ORDER — AMOXICILLIN-POT CLAVULANATE 875-125 MG PO TABS: 1.0000 | ORAL_TABLET | Freq: Two times a day (BID) | ORAL | 0 refills | Status: DC

## 2023-09-10 MED ORDER — ALUM & MAG HYDROXIDE-SIMETH 200-200-20 MG/5ML PO SUSP
30.0000 mL | Freq: Once | ORAL | Status: AC
  Administered 2023-09-10: 30 mL via ORAL
  Filled 2023-09-10: qty 30

## 2023-09-10 MED ORDER — AMOXICILLIN-POT CLAVULANATE 875-125 MG PO TABS
1.0000 | ORAL_TABLET | Freq: Once | ORAL | Status: AC
  Administered 2023-09-10: 1 via ORAL
  Filled 2023-09-10: qty 1

## 2023-09-10 NOTE — ED Triage Notes (Signed)
The patient has been having abd pain for a month. She stated the last two days the pain is worse. Last night her heart rate was 120. She is also having pain between her shoulder blades that is new.

## 2023-09-10 NOTE — ED Provider Notes (Signed)
[place] [facility] AT [facility] Provider Note   CSN: [id number] Arrival date & time: 09/10/23  1014     History  Chief Complaint  Patient presents with   Abdominal Pain    Susan Abbott is a 57 y.o. female.  Pt is a 57 yo female with pmhx significant for GERD, IBS, and anxiety.  Pt has been having problems with GERD for a few months.  She has been taking nexium and tums, but sx have not improved.   She has not seen GI.  She is now having worsening of sx for the past few days.  No bm in 4 days.  She is also having pain between her shoulders and hr was elevated last night.  Some sob.  No fevers.         Home Medications Prior to Admission medications   Medication Sig Start Date End Date Taking? Authorizing Provider  amoxicillin-clavulanate (AUGMENTIN) 875-125 MG tablet Take 1 tablet by mouth every 12 (twelve) hours. 09/10/23  Yes Jacalyn Lefevre, MD  Probiotic Product (ALIGN) 4 MG CAPS Take 1 capsule (4 mg total) by mouth daily. 09/10/23  Yes Jacalyn Lefevre, MD  acetaminophen (TYLENOL) 325 MG tablet Take 650 mg by mouth every 6 (six) hours as needed for mild pain.    [provider]  diazepam (VALIUM) 5 MG tablet Take 5 mg by mouth daily as needed for anxiety.    [provider]  metoprolol succinate (TOPROL-XL) 25 MG 24 hr tablet Take 1 tablet by mouth daily. 02/04/21   [provider]      Allergies    Doxycycline, Shellfish allergy, and Solu-medrol [methylprednisolone sodium succ]    Review of Systems   Review of Systems  Cardiovascular:  Positive for chest pain.  Gastrointestinal:  Positive for abdominal pain.  All other systems reviewed and are negative.   Physical Exam Updated Vital Signs BP (!) 149/91 (BP Location: Left Arm)   Pulse 91   Temp 98.5 F (36.9 C)   Resp 17   Ht 5\' 3"  (1.6 m)   Wt 65 kg   SpO2 94%   BMI 25.38 kg/m  Physical Exam Vitals and nursing note reviewed.  Constitutional:       Appearance: She is well-developed.  HENT:     Head: Normocephalic and atraumatic.     Mouth/Throat:     Mouth: Mucous membranes are moist.     Pharynx: Oropharynx is clear.  Eyes:     Extraocular Movements: Extraocular movements intact.     Pupils: Pupils are equal, round, and reactive to light.  Cardiovascular:     Rate and Rhythm: Normal rate and regular rhythm.     Heart sounds: Normal heart sounds.  Pulmonary:     Effort: Pulmonary effort is normal.     Breath sounds: Normal breath sounds.  Abdominal:     General: Abdomen is flat. Bowel sounds are normal.     Palpations: Abdomen is soft.     Tenderness: There is generalized abdominal tenderness.  Skin:    General: Skin is warm and dry.     Capillary Refill: Capillary refill takes less than 2 seconds.  Neurological:     General: No focal deficit present.     Mental Status: She is alert and oriented to person, place, and time.  Psychiatric:        Mood and Affect: Mood normal.        Behavior: Behavior normal.  ED Results / Procedures / Treatments   Labs (all labs ordered are listed, but only abnormal results are displayed) Labs Reviewed  COMPREHENSIVE METABOLIC PANEL - Abnormal; Notable for the following components:      Result Value   CO2 21 (*)    Glucose, Bld 105 (*)    All other components within normal limits  CBC - Abnormal; Notable for the following components:   WBC 11.4 (*)    All other components within normal limits  URINALYSIS, ROUTINE W REFLEX MICROSCOPIC - Abnormal; Notable for the following components:   Color, Urine STRAW (*)    All other components within normal limits  LIPASE, BLOOD  TROPONIN I (HIGH SENSITIVITY)    EKG EKG Interpretation Date/Time:  Sunday September 10 2023 10:26:17 EST Ventricular Rate:  91 PR Interval:  150 QRS Duration:  92 QT Interval:  367 QTC Calculation: 452 R Axis:   58  Text Interpretation: Sinus rhythm Low voltage, extremity and precordial leads No  significant change since last tracing Confirmed by Jacalyn Lefevre [id number]) on 09/10/2023 11:15:56 AM  Radiology CT ABDOMEN PELVIS W CONTRAST  Result Date: 09/10/2023 CLINICAL DATA:  Abdominal pain for the past month, worse over the past 2 days. EXAM: CT ABDOMEN AND PELVIS WITH CONTRAST TECHNIQUE: Multidetector CT imaging of the abdomen and pelvis was performed using the standard protocol following bolus administration of intravenous contrast. RADIATION DOSE REDUCTION: This exam was performed according to the departmental dose-optimization program which includes automated exposure control, adjustment of the mA and/or kV according to patient size and/or use of iterative reconstruction technique. CONTRAST:  OMNIPAQUE IOHEXOL 350 MG/ML SOLN COMPARISON:  None Available. FINDINGS: Lower chest: No acute abnormality.  Trace pericardial effusion. Hepatobiliary: No focal liver abnormality is seen. No gallstones, gallbladder wall thickening, or biliary dilatation. Pancreas: Unremarkable. No pancreatic ductal dilatation or surrounding inflammatory changes. Spleen: Normal in size without focal abnormality. Adrenals/Urinary Tract: Adrenal glands are unremarkable. Kidneys are normal, without renal calculi, focal lesion, or hydronephrosis. Bladder is unremarkable. Stomach/Bowel: The stomach and small bowel are unremarkable. Wall thickening of the mid sigmoid colon adjacent to an inflamed diverticulum. Surrounding inflammatory change. No extraluminal air or fluid collection. Normal appendix. Vascular/Lymphatic: Aortic atherosclerosis. No enlarged abdominal or pelvic lymph nodes. Reproductive: Uterus and bilateral adnexa are unremarkable. Other: Trace free fluid pelvis.  No pneumoperitoneum. Musculoskeletal: No acute or significant osseous findings. IMPRESSION: 1. Acute uncomplicated sigmoid diverticulitis. 2.  Aortic Atherosclerosis (ICD10-I70.0). Electronically Signed   By: Obie Dredge M.D.   On: 09/10/2023 11:59    CT Angio Chest PE W and/or Wo Contrast  Result Date: 09/10/2023 CLINICAL DATA:  Pulmonary embolism (PE) suspected, high prob EXAM: CT ANGIOGRAPHY CHEST WITH CONTRAST TECHNIQUE: Multidetector CT imaging of the chest was performed using the standard protocol during bolus administration of intravenous contrast. Multiplanar CT image reconstructions and MIPs were obtained to evaluate the vascular anatomy. RADIATION DOSE REDUCTION: This exam was performed according to the departmental dose-optimization program which includes automated exposure control, adjustment of the mA and/or kV according to patient size and/or use of iterative reconstruction technique. CONTRAST:  OMNIPAQUE IOHEXOL 350 MG/ML SOLN COMPARISON:  None Available. FINDINGS: Cardiovascular: Satisfactory opacification of the pulmonary arteries to the segmental level. No evidence of pulmonary embolism. Normal heart size. No pericardial effusion. Mediastinum/Nodes: No enlarged mediastinal, hilar, or axillary lymph nodes. Thyroid gland, trachea, and esophagus demonstrate no significant findings. Lungs/Pleura: Lungs are clear. No pleural effusion or pneumothorax. Upper Abdomen: See separately dictated same day CT  abdomen and pelvis for additional findings Musculoskeletal: No chest wall abnormality. No acute or significant osseous findings. Review of the MIP images confirms the above findings. IMPRESSION: No evidence of pulmonary embolism or other acute intrathoracic process. Electronically Signed   By: Lorenza Cambridge M.D.   On: 09/10/2023 11:49    Procedures Procedures    Medications Ordered in ED Medications  amoxicillin-clavulanate (AUGMENTIN) 875-125 MG per tablet 1 tablet (has no administration in time range)  iohexol (OMNIPAQUE) 350 MG/ML injection 100 mL (100 mLs Intravenous Contrast Given 09/10/23 1116)  alum & mag hydroxide-simeth (MAALOX/MYLANTA) 200-200-20 MG/5ML suspension 30 mL (30 mLs Oral Given 09/10/23 1131)    ED Course/  Medical Decision Making/ A&P                                 Medical Decision Making Amount and/or Complexity of Data Reviewed Labs: ordered. Radiology: ordered.  Risk OTC drugs. Prescription drug management.   This patient presents to the ED for concern of abd pain, this involves an extensive number of treatment options, and is a complaint that carries with it a high risk of complications and morbidity.  The differential diagnosis includes gerd, cholecystitis, PE, uti, constipation   Co morbidities that complicate the patient evaluation  GERD and IBS   Additional history obtained:  Additional history obtained from epic chart review External records from outside source obtained and reviewed including husband   Lab Tests:  I Ordered, and personally interpreted labs.  The pertinent results include:  cbc nl, cmp nl, ua nl, lip nl   Imaging Studies ordered:  I ordered imaging studies including ct abd/pelvis; cta chest  I independently visualized and interpreted imaging which showed  CTA chest: No evidence of pulmonary embolism or other acute intrathoracic  process.   CT abd/pelvis: Acute uncomplicated sigmoid diverticulitis.  2.  Aortic Atherosclerosis (ICD10-I70.0).   I agree with the radiologist interpretation   Cardiac Monitoring:  The patient was maintained on a cardiac monitor.  I personally viewed and interpreted the cardiac monitored which showed an underlying rhythm of: nsr   Medicines ordered and prescription drug management:  I ordered medication including ivfs/pepcid/gi cocktail  for sx  Reevaluation of the patient after these medicines showed that the patient improved I have reviewed the patients home medicines and have made adjustments as needed   Test Considered:  ct   Critical Interventions:  abx  Problem List / ED Course:  Diverticulitis:  pt is feeling better.  She did not want anything for pain.  She is started on Augmentin.  Pt is  stable for d/c.  Return if worse.  F/u with pcp and with GI.   Reevaluation:  After the interventions noted above, I reevaluated the patient and found that they have :improved   Social Determinants of Health:  Lives at home with husband   Dispostion:  After consideration of the diagnostic results and the patients response to treatment, I feel that the patent would benefit from discharge with outpatient f/u.          Final Clinical Impression(s) / ED Diagnoses Final diagnoses:  Acute diverticulitis    Rx / DC Orders ED Discharge Orders          Ordered    amoxicillin-clavulanate (AUGMENTIN) 875-125 MG tablet  Every 12 hours        09/10/23 1212    Probiotic Product (ALIGN) 4 MG CAPS  Daily        09/10/23 1213              Jacalyn Lefevre, MD 09/10/23 1215

## 2023-10-13 ENCOUNTER — Emergency Department (HOSPITAL_COMMUNITY)
Admission: EM | Admit: 2023-10-13 | Discharge: 2023-10-14 | Disposition: A | Discharge status: Home / Self Care | Payer: Commercial Managed Care - PPO | Attending: Emergency Medicine | Admitting: Emergency Medicine

## 2023-10-13 ENCOUNTER — Emergency Department (HOSPITAL_COMMUNITY): Payer: Commercial Managed Care - PPO

## 2023-10-13 ENCOUNTER — Encounter (HOSPITAL_COMMUNITY): Payer: Self-pay

## 2023-10-13 ENCOUNTER — Other Ambulatory Visit: Payer: Self-pay

## 2023-10-13 DIAGNOSIS — I10 Essential (primary) hypertension: Secondary | ICD-10-CM | POA: Diagnosis not present

## 2023-10-13 DIAGNOSIS — N9489 Other specified conditions associated with female genital organs and menstrual cycle: Secondary | ICD-10-CM | POA: Insufficient documentation

## 2023-10-13 DIAGNOSIS — K760 Fatty (change of) liver, not elsewhere classified: Secondary | ICD-10-CM | POA: Diagnosis not present

## 2023-10-13 DIAGNOSIS — K5732 Diverticulitis of large intestine without perforation or abscess without bleeding: Secondary | ICD-10-CM | POA: Diagnosis not present

## 2023-10-13 DIAGNOSIS — K5792 Diverticulitis of intestine, part unspecified, without perforation or abscess without bleeding: Secondary | ICD-10-CM

## 2023-10-13 DIAGNOSIS — R1032 Left lower quadrant pain: Secondary | ICD-10-CM | POA: Diagnosis present

## 2023-10-13 LAB — COMPREHENSIVE METABOLIC PANEL
ALT: 21 U/L (ref 0–44)
AST: 19 U/L (ref 15–41)
Albumin: 4.2 g/dL (ref 3.5–5.0)
Alkaline Phosphatase: 72 U/L (ref 38–126)
Anion gap: 8 (ref 5–15)
BUN: 9 mg/dL (ref 6–20)
CO2: 22 mmol/L (ref 22–32)
Calcium: 8.8 mg/dL — ABNORMAL LOW (ref 8.9–10.3)
Chloride: 102 mmol/L (ref 98–111)
Creatinine, Ser: 0.71 mg/dL (ref 0.44–1.00)
GFR, Estimated: >60 mL/min (ref >60)
Glucose, Bld: 113 mg/dL — ABNORMAL HIGH (ref 70–99)
Potassium: 3.7 mmol/L (ref 3.5–5.1)
Sodium: 132 mmol/L — ABNORMAL LOW (ref 135–145)
Total Bilirubin: 0.4 mg/dL (ref <1.2)
Total Protein: 8 g/dL (ref 6.5–8.1)

## 2023-10-13 LAB — CBC
HCT: 41.8 % (ref 36.0–46.0)
Hemoglobin: 13.9 g/dL (ref 12.0–15.0)
MCH: 29.8 pg (ref 26.0–34.0)
MCHC: 33.3 g/dL (ref 30.0–36.0)
MCV: 89.5 fL (ref 80.0–100.0)
Platelets: 216 10*3/uL (ref 150–400)
RBC: 4.67 MIL/uL (ref 3.87–5.11)
RDW: 12.9 % (ref 11.5–15.5)
WBC: 7.3 10*3/uL (ref 4.0–10.5)
nRBC: 0 % (ref 0.0–0.2)

## 2023-10-13 LAB — URINALYSIS, ROUTINE W REFLEX MICROSCOPIC
Bilirubin Urine: NEGATIVE
Glucose, UA: NEGATIVE mg/dL
Hgb urine dipstick: NEGATIVE
Ketones, ur: NEGATIVE mg/dL
Leukocytes,Ua: NEGATIVE
Nitrite: NEGATIVE
Protein, ur: NEGATIVE mg/dL
Specific Gravity, Urine: 1.013 (ref 1.005–1.030)
pH: 5 (ref 5.0–8.0)

## 2023-10-13 LAB — LIPASE, BLOOD: Lipase: 42 U/L (ref 11–51)

## 2023-10-13 MED ORDER — IOHEXOL 300 MG/ML  SOLN
100.0000 mL | Freq: Once | INTRAMUSCULAR | Status: AC | PRN
  Administered 2023-10-13: 100 mL via INTRAVENOUS

## 2023-10-13 NOTE — ED Triage Notes (Signed)
Pt to ED by POV from [facility] with c/o abdominal pain. Pt has a history of diverticulitis and states this feels similar to when she had it last. Pt endorses nausea, no vomiting or diarrhea. Arrives A+O, VSS, NADN.

## 2023-10-13 NOTE — ED Provider Triage Note (Signed)
Emergency Medicine Provider Triage Evaluation Note  Susan Abbott , a 57 y.o. female  was evaluated in triage.  Pt complains of abd pain. Was diagnosed with diverticulitis on CT scan 3 weeks ago.  Was on augmentin x 1 week with some improvement.  Now worsening pain and pain to LLQ.  No fever, chills, body aches, or dysuria  Review of Systems  Positive: As above Negative: As above  Physical Exam  BP (!) 145/87 (BP Location: Left Arm)   Pulse 60   Temp 97.6 F (36.4 C) (Oral)   Resp 18   Ht 5\' 3"  (1.6 m)   Wt 65.8 kg   SpO2 99%   BMI 25.69 kg/m  Gen:   Awake, no distress   Resp:  Normal effort  MSK:   Moves extremities without difficulty  Other:    Medical Decision Making  Medically screening exam initiated at 9:59 PM.  Appropriate orders placed.  Susan Abbott was informed that the remainder of the evaluation will be completed by another provider, this initial triage assessment does not replace that evaluation, and the importance of remaining in the ED until their evaluation is complete.     Fayrene Helper, PA-C 10/13/23 2201

## 2023-10-14 MED ORDER — SUCRALFATE 1 G PO TABS: 1.0000 g | ORAL_TABLET | Freq: Three times a day (TID) | ORAL | 0 refills | Status: AC

## 2023-10-14 MED ORDER — AMOXICILLIN-POT CLAVULANATE 875-125 MG PO TABS: 1.0000 | ORAL_TABLET | Freq: Two times a day (BID) | ORAL | 0 refills | Status: AC

## 2023-10-14 NOTE — ED Provider Notes (Signed)
[place] [facility] AT [facility] Provider Note  CSN: [id number] Arrival date & time: 10/13/23 2115  Chief Complaint(s) Abdominal Pain  HPI Susan Abbott is a 57 y.o. female with past medical history as below, significant for anxiety, GERD, IBS, hypertension who presents to the ED with complaint of abd pain LLQ  She was seen 11/24 with similar complaints, started on Augmentin for diverticulitis.  Symptoms improved but recurred approximately 1 week later followed completion of antibiotics.  Pain primarily left lower quadrant, aching, cramping sensation.  No BRBPR or melena.  No fevers.  No vomiting.  She was seen by gastroenterology in the past, history of gastritis and prior EGD.  She was started on Nexium around 6 weeks ago, upper GI symptoms greatly improved following initiation of Nexium.  Past Medical History Past Medical History:  Diagnosis Date   Anxiety    GERD (gastroesophageal reflux disease)    IBS (irritable bowel syndrome)    Patient Active Problem List   Diagnosis Date Noted   Elevated coronary artery calcium score 08/31/2021   Essential hypertension 08/31/2021   Precordial chest pain 02/08/2021   TIA (transient ischemic attack) 08/13/2019   Facial numbness    Elevated blood pressure reading    Dizziness    Home Medication(s) Prior to Admission medications   Medication Sig Start Date End Date Taking? Authorizing Provider  amoxicillin-clavulanate (AUGMENTIN) 875-125 MG tablet Take 1 tablet by mouth every 12 (twelve) hours for 10 days. 10/14/23 10/24/23 Yes Tanda Rockers A, DO  sucralfate (CARAFATE) 1 g tablet Take 1 tablet (1 g total) by mouth with breakfast, with lunch, and with evening meal for 7 days. 10/14/23 10/21/23 Yes Sloan Leiter, DO  acetaminophen (TYLENOL) 325 MG tablet Take 650 mg by mouth every 6 (six) hours as needed for mild pain.    [provider]  diazepam (VALIUM) 5 MG tablet Take 5 mg by mouth daily as needed for  anxiety.    [provider]  metoprolol succinate (TOPROL-XL) 25 MG 24 hr tablet Take 1 tablet by mouth daily. 02/04/21   [provider]  Probiotic Product (ALIGN) 4 MG CAPS Take 1 capsule (4 mg total) by mouth daily. 09/10/23   Jacalyn Lefevre, MD                                                                                                                                    Past Surgical History Past Surgical History:  Procedure Laterality Date   None     Family History Family History  Problem Relation Age of Onset   Hypertension Mother    Cancer Mother    Emphysema Father    Cirrhosis Father     Social History Social History   Tobacco Use   Smoking status: Never   Smokeless tobacco: Never  Vaping Use   Vaping status: Never Used  Substance Use Topics  Alcohol use: Never   Drug use: Never   Allergies Doxycycline, Shellfish allergy, and Solu-medrol [methylprednisolone sodium succ]  Review of Systems Review of Systems  Constitutional:  Positive for appetite change. Negative for chills and fever.  Respiratory:  Negative for chest tightness and shortness of breath.   Gastrointestinal:  Positive for abdominal pain. Negative for diarrhea, nausea and vomiting.  Genitourinary:  Negative for dysuria, hematuria, urgency and vaginal bleeding.  Musculoskeletal:  Negative for arthralgias.  Neurological:  Negative for headaches.  All other systems reviewed and are negative.   Physical Exam Vital Signs  I have reviewed the triage vital signs BP 136/81   Pulse (!) 59   Temp 97.6 F (36.4 C) (Oral)   Resp 18   Ht 5\' 3"  (1.6 m)   Wt 65.8 kg   SpO2 99%   BMI 25.69 kg/m  Physical Exam Vitals and nursing note reviewed.  Constitutional:      General: She is not in acute distress.    Appearance: Normal appearance. She is well-developed. She is not ill-appearing.  HENT:     Head: Normocephalic and atraumatic.     Right Ear: External ear normal.     Left  Ear: External ear normal.     Nose: Nose normal.     Mouth/Throat:     Mouth: Mucous membranes are moist.  Eyes:     General: No scleral icterus.       Right eye: No discharge.        Left eye: No discharge.  Cardiovascular:     Rate and Rhythm: Normal rate.  Pulmonary:     Effort: Pulmonary effort is normal. No respiratory distress.     Breath sounds: No stridor.  Abdominal:     General: Abdomen is flat. There is no distension.     Tenderness: There is abdominal tenderness in the left lower quadrant. There is no guarding or rebound. Negative signs include Murphy's sign and Rovsing's sign.    Musculoskeletal:        General: No deformity.     Cervical back: No rigidity.  Skin:    General: Skin is warm and dry.     Coloration: Skin is not cyanotic, jaundiced or pale.  Neurological:     Mental Status: She is alert and oriented to person, place, and time.     GCS: GCS eye subscore is 4. GCS verbal subscore is 5. GCS motor subscore is 6.  Psychiatric:        Speech: Speech normal.        Behavior: Behavior normal. Behavior is cooperative.     ED Results and Treatments Labs (all labs ordered are listed, but only abnormal results are displayed) Labs Reviewed  COMPREHENSIVE METABOLIC PANEL - Abnormal; Notable for the following components:      Result Value   Sodium 132 (*)    Glucose, Bld 113 (*)    Calcium 8.8 (*)    All other components within normal limits  LIPASE, BLOOD  CBC  URINALYSIS, ROUTINE W REFLEX MICROSCOPIC  Radiology CT ABDOMEN PELVIS W CONTRAST Result Date: 10/13/2023 CLINICAL DATA:  Left lower quadrant pain. History of diverticulitis. EXAM: CT ABDOMEN AND PELVIS WITH CONTRAST TECHNIQUE: Multidetector CT imaging of the abdomen and pelvis was performed using the standard protocol following bolus administration of intravenous contrast.  RADIATION DOSE REDUCTION: This exam was performed according to the departmental dose-optimization program which includes automated exposure control, adjustment of the mA and/or kV according to patient size and/or use of iterative reconstruction technique. CONTRAST:  OMNIPAQUE IOHEXOL 300 MG/ML  SOLN COMPARISON:  09/10/2023 FINDINGS: Lower chest: Small pericardial effusion is unchanged. No acute basilar airspace disease. Hepatobiliary: No focal liver abnormality. The left lobe of the liver wraps into the left upper quadrant as before. Decreased hepatic density typical of steatosis. Contracted gallbladder. No calcified gallstones or pericholecystic fat stranding. Pancreas: No ductal dilatation or inflammation. Spleen: Normal in size without focal abnormality. Adrenals/Urinary Tract: Normal adrenal glands. No hydronephrosis, renal calculi or renal inflammation. Minimally distended urinary bladder, normal for degree of distension. Stomach/Bowel: New area of sigmoid colonic wall thickening in the left lower quadrant in the region of multiple diverticula, series 2, image 75, typical of diverticulitis. This is more proximal than the area of colonic inflammatory change from prior. No abscess or free intra-abdominal air. Area of previous wall thickening in the mid sigmoid colon has resolved. Small to moderate volume of stool in the more proximal colon. No small bowel obstruction or inflammation. Normal appendix. Vascular/Lymphatic: Moderate aortic atherosclerosis. No aneurysm. The portal vein is patent. No abdominopelvic adenopathy. Reproductive: Increased bilateral periuterine and adnexal vascularity with dilatation of the left ovarian vein at 7 mm. No adnexal mass. Other: Mild fat stranding in the pelvis adjacent diverticula and dependently. Trace free fluid but no drainable collection or free air. Diminutive fat containing umbilical hernia. Musculoskeletal: There are no acute or suspicious osseous abnormalities.  IMPRESSION: 1. Acute uncomplicated sigmoid diverticulitis. 2. Increased bilateral periuterine and adnexal vascularity with dilatation of the left ovarian vein. This can be seen with pelvic congestion syndrome in the appropriate clinical setting. 3. Hepatic steatosis. Aortic Atherosclerosis (ICD10-I70.0). Electronically Signed   By: Narda Rutherford M.D.   On: 10/13/2023 23:24    Pertinent labs & imaging results that were available during my care of the patient were reviewed by me and considered in my medical decision making (see MDM for details).  Medications Ordered in ED Medications  iohexol (OMNIPAQUE) 300 MG/ML solution 100 mL (100 mLs Intravenous Contrast Given 10/13/23 2256)                                                                                                                                     Procedures Procedures  (including critical care time)  Medical Decision Making / ED Course    Medical Decision Making:    Laela Vought is a 57 y.o. female with past medical history as below, significant for anxiety, GERD,  IBS, hypertension who presents to the ED with complaint of abd pain LLQ. The complaint involves an extensive differential diagnosis and also carries with it a high risk of complications and morbidity.  Serious etiology was considered. Ddx includes but is not limited to: Differential diagnosis includes but is not exclusive to ectopic pregnancy, ovarian cyst, ovarian torsion, acute appendicitis, urinary tract infection, endometriosis, bowel obstruction, hernia, colitis, renal colic, gastroenteritis, volvulus etc.   Complete initial physical exam performed, notably the patient was in no distress, abdomen nonperitoneal, mild TTP left lower quadrant.    Reviewed and confirmed nursing documentation for past medical history, family history, social history.  Vital signs reviewed.    Labs and imaging ordered in triage  Clinical Course as of 10/14/23 0053  Sat Oct 14, 2023  0020 CTAP with uncomplicated diverticulitis (sigmoid), ?pelvic congestion syndrome  [SG]    Clinical Course User Index [SG] Sloan Leiter, DO    Brief summary: 57 year old female here with left lower quad abdominal pain, recent diagnosis diverticulitis.  Labs and imaging ordered in triage.  Labs are stable, CT concerning for uncomplicated diverticulitis also possible pelvic congestion syndrome.  Start patient on oral antibiotics, liquid diet, f/u w/ GI and OB/GYN.  Encouraged her to continue Nexium, clinical diet, also add Carafate.  Encouraged o/p f/u for pos pelvic congestion syndrome, also f/u pcp in regards to hepatic steatosis. O/w see GI in regards to diverticulitis. Will add abx again as this seemed to help last time, no evidence of abscess or systemic infection in regards to diverticulitis today.   The patient improved significantly and was discharged in stable condition. Detailed discussions were had with the patient regarding current findings, and need for close f/u with PCP or on call doctor. The patient has been instructed to return immediately if the symptoms worsen in any way for re-evaluation. Patient verbalized understanding and is in agreement with current care plan. All questions answered prior to discharge.                  Additional history obtained: -Additional history obtained from spouse -External records from outside source obtained and reviewed including: Chart review including previous notes, labs, imaging, consultation notes including  Prior labs and imaging, prior ED visit, primary care documentation, home medications   Lab Tests: -I ordered, reviewed, and interpreted labs.   The pertinent results include:   Labs Reviewed  COMPREHENSIVE METABOLIC PANEL - Abnormal; Notable for the following components:      Result Value   Sodium 132 (*)    Glucose, Bld 113 (*)    Calcium 8.8 (*)    All other components within normal limits   LIPASE, BLOOD  CBC  URINALYSIS, ROUTINE W REFLEX MICROSCOPIC    Notable for stable labs  EKG   EKG Interpretation Date/Time:    Ventricular Rate:    PR Interval:    QRS Duration:    QT Interval:    QTC Calculation:   R Axis:      Text Interpretation:           Imaging Studies ordered: I ordered imaging studies including CTAP I independently visualized the following imaging with scope of interpretation limited to determining acute life threatening conditions related to emergency care; findings noted above I independently visualized and interpreted imaging. I agree with the radiologist interpretation   Medicines ordered and prescription drug management: Meds ordered this encounter  Medications   iohexol (OMNIPAQUE) 300 MG/ML solution 100 mL  amoxicillin-clavulanate (AUGMENTIN) 875-125 MG tablet    Sig: Take 1 tablet by mouth every 12 (twelve) hours for 10 days.    Dispense:  20 tablet    Refill:  0   sucralfate (CARAFATE) 1 g tablet    Sig: Take 1 tablet (1 g total) by mouth with breakfast, with lunch, and with evening meal for 7 days.    Dispense:  21 tablet    Refill:  0    -I have reviewed the patients home medicines and have made adjustments as needed   Consultations Obtained: na   Cardiac Monitoring: Continuous pulse oximetry interpreted by myself, 100% on RA.    Social Determinants of Health:  Diagnosis or treatment significantly limited by social determinants of health: lives at home    Reevaluation: After the interventions noted above, I reevaluated the patient and found that they have improved  Co morbidities that complicate the patient evaluation  Past Medical History:  Diagnosis Date   Anxiety    GERD (gastroesophageal reflux disease)    IBS (irritable bowel syndrome)       Dispostion: Disposition decision including need for hospitalization was considered, and patient discharged from emergency department.    Final Clinical  Impression(s) / ED Diagnoses Final diagnoses:  Diverticulitis  Hepatic steatosis  Pelvic congestion syndrome        Sloan Leiter, DO 10/14/23 1610

## 2023-10-14 NOTE — Discharge Instructions (Addendum)
It was a pleasure caring for you today in the emergency department.  Your imaging today shows evidence of ongoing diverticulitis, this should resolve with bowel rest/clinical diet.  Antibiotics also been prescribed since the seem to have improved your symptoms last time.   There was also evidence of some fat deposits in your liver called hepatic steatosis.  Please follow-up with your PCP in regards to this.  Also evidence of enlarged blood vessels in your pelvic area which sometimes can be seen with pelvic congestion syndrome.  Please follow-up with your OB/GYN. These are chronic conditions that do not require emergent workup or further evaluation in the ER setting.   Please return to the emergency department for any worsening or worrisome symptoms.

## 2023-10-19 ENCOUNTER — Encounter (HOSPITAL_COMMUNITY): Payer: Self-pay

## 2023-10-19 ENCOUNTER — Emergency Department (HOSPITAL_COMMUNITY)
Admission: EM | Admit: 2023-10-19 | Discharge: 2023-10-20 | Disposition: A | Discharge status: Home / Self Care | Payer: Commercial Managed Care - PPO | Attending: Emergency Medicine | Admitting: Emergency Medicine

## 2023-10-19 ENCOUNTER — Other Ambulatory Visit: Payer: Self-pay

## 2023-10-19 DIAGNOSIS — R1010 Upper abdominal pain, unspecified: Secondary | ICD-10-CM | POA: Diagnosis present

## 2023-10-19 DIAGNOSIS — R1013 Epigastric pain: Secondary | ICD-10-CM | POA: Diagnosis not present

## 2023-10-19 LAB — COMPREHENSIVE METABOLIC PANEL
ALT: 25 U/L (ref 0–44)
AST: 21 U/L (ref 15–41)
Albumin: 4.6 g/dL (ref 3.5–5.0)
Alkaline Phosphatase: 70 U/L (ref 38–126)
Anion gap: 8 (ref 5–15)
BUN: 8 mg/dL (ref 6–20)
CO2: 22 mmol/L (ref 22–32)
Calcium: 9.2 mg/dL (ref 8.9–10.3)
Chloride: 103 mmol/L (ref 98–111)
Creatinine, Ser: 0.67 mg/dL (ref 0.44–1.00)
GFR, Estimated: >60 mL/min (ref >60)
Glucose, Bld: 101 mg/dL — ABNORMAL HIGH (ref 70–99)
Potassium: 3.9 mmol/L (ref 3.5–5.1)
Sodium: 133 mmol/L — ABNORMAL LOW (ref 135–145)
Total Bilirubin: 0.5 mg/dL (ref 0.0–1.2)
Total Protein: 8.3 g/dL — ABNORMAL HIGH (ref 6.5–8.1)

## 2023-10-19 LAB — URINALYSIS, ROUTINE W REFLEX MICROSCOPIC
Bilirubin Urine: NEGATIVE
Glucose, UA: NEGATIVE mg/dL
Hgb urine dipstick: NEGATIVE
Ketones, ur: NEGATIVE mg/dL
Leukocytes,Ua: NEGATIVE
Nitrite: NEGATIVE
Protein, ur: NEGATIVE mg/dL
Specific Gravity, Urine: 1.006 (ref 1.005–1.030)
pH: 6 (ref 5.0–8.0)

## 2023-10-19 LAB — CBC
HCT: 43.5 % (ref 36.0–46.0)
Hemoglobin: 14.8 g/dL (ref 12.0–15.0)
MCH: 30.5 pg (ref 26.0–34.0)
MCHC: 34 g/dL (ref 30.0–36.0)
MCV: 89.5 fL (ref 80.0–100.0)
Platelets: 247 10*3/uL (ref 150–400)
RBC: 4.86 MIL/uL (ref 3.87–5.11)
RDW: 12.6 % (ref 11.5–15.5)
WBC: 8.6 10*3/uL (ref 4.0–10.5)
nRBC: 0 % (ref 0.0–0.2)

## 2023-10-19 LAB — LIPASE, BLOOD: Lipase: 36 U/L (ref 11–51)

## 2023-10-19 NOTE — ED Notes (Signed)
 Pt came to EMT first asking how much longer and that her pain is getting worse, RN notified

## 2023-10-19 NOTE — ED Triage Notes (Signed)
 Pt arrives via POV. Pt reports ongoing abdominal pain, nausea, and diarrhea. Hx of diverticulitis.

## 2023-10-20 ENCOUNTER — Emergency Department (HOSPITAL_COMMUNITY): Payer: Commercial Managed Care - PPO

## 2023-10-20 MED ORDER — AMOXICILLIN-POT CLAVULANATE 875-125 MG PO TABS
1.0000 | ORAL_TABLET | Freq: Once | ORAL | Status: AC
  Administered 2023-10-20: 1 via ORAL
  Filled 2023-10-20: qty 1

## 2023-10-20 MED ORDER — ACETAMINOPHEN 325 MG PO TABS
650.0000 mg | ORAL_TABLET | Freq: Once | ORAL | Status: AC
  Administered 2023-10-20: 650 mg via ORAL
  Filled 2023-10-20: qty 2

## 2023-10-20 MED ORDER — ALUM & MAG HYDROXIDE-SIMETH 200-200-20 MG/5ML PO SUSP
30.0000 mL | Freq: Once | ORAL | Status: AC
  Administered 2023-10-20: 30 mL via ORAL
  Filled 2023-10-20: qty 30

## 2023-10-20 NOTE — ED Notes (Signed)
 Pt refused O2 and temp, bp was taken

## 2023-10-20 NOTE — Discharge Instructions (Signed)
   SEEK IMMEDIATE MEDICAL ATTENTION IF: The pain does not go away or becomes severe, particularly over the next 8-12 hours.  A temperature above 100.2F develops.  Repeated vomiting occurs (multiple episodes).   Blood is being passed in stools or vomit (bright red or black tarry stools).  Return also if you develop chest pain, difficulty breathing, dizziness or fainting, or become confused, poorly responsive, or inconsolable.

## 2023-10-20 NOTE — ED Provider Notes (Signed)
 [place] EMERGENCY DEPARTMENT AT [facility] Provider Note   CSN: [id number] Arrival date & time: 10/19/23  1554     History  Chief Complaint  Patient presents with   Abdominal Pain    Susan Abbott is a 58 y.o. female.  The history is provided by the patient and the spouse.   Patient w/history of anxiety and GERD presents for concern of return of diverticulitis.  Patient has had 2 separate episodes of diverticulitis over the past month.  Most recent episode was diagnosed on December 27 and was given Augmentin .  Despite taking these medications, it has been worsening.  She is also having pain in her upper abdomen No fevers or vomiting.  She has had nonbloody diarrhea.  No active chest pain but the pain is just under her chest and her upper abdomen No previous abdominal surgeries.  She has had colonoscopies previously   Past Medical History:  Diagnosis Date   Anxiety    GERD (gastroesophageal reflux disease)    IBS (irritable bowel syndrome)     Home Medications Prior to Admission medications   Medication Sig Start Date End Date Taking? Authorizing Provider  acetaminophen  (TYLENOL ) 325 MG tablet Take 650 mg by mouth every 6 (six) hours as needed for mild pain.    [provider]  amoxicillin -clavulanate (AUGMENTIN ) 875-125 MG tablet Take 1 tablet by mouth every 12 (twelve) hours for 10 days. 10/14/23 10/24/23  Elnor Savant A, DO  diazepam  (VALIUM ) 5 MG tablet Take 5 mg by mouth daily as needed for anxiety.    [provider]  metoprolol succinate (TOPROL-XL) 25 MG 24 hr tablet Take 1 tablet by mouth daily. 02/04/21   [provider]  Probiotic Product (ALIGN) 4 MG CAPS Take 1 capsule (4 mg total) by mouth daily. 09/10/23   Dean Clarity, MD  sucralfate  (CARAFATE ) 1 g tablet Take 1 tablet (1 g total) by mouth with breakfast, with lunch, and with evening meal for 7 days. 10/14/23 10/21/23  Elnor Savant LABOR, DO      Allergies     Doxycycline, Shellfish allergy, and Solu-medrol [methylprednisolone sodium succ]    Review of Systems   Review of Systems  Constitutional:  Negative for fever.  Gastrointestinal:  Positive for diarrhea. Negative for blood in stool and vomiting.    Physical Exam Updated Vital Signs BP (!) 135/95 (BP Location: Right Arm)   Pulse (!) 52   Temp 98.9 F (37.2 C) (Oral)   Resp 15   SpO2 100%  Physical Exam CONSTITUTIONAL: Well developed/well nourished, no distress HEAD: Normocephalic/atraumatic EYES: EOMI/PERRL ENMT: Mucous membranes moist NECK: supple no meningeal signs CV: S1/S2 noted, no murmurs/rubs/gallops noted LUNGS: Lungs are clear to auscultation bilaterally, no apparent distress ABDOMEN: soft, mild diffuse tenderness, no rebound or guarding, bowel sounds noted throughout abdomen GU:no cva tenderness NEURO: Pt is awake/alert/appropriate, moves all extremitiesx4.  No facial droop.   EXTREMITIES:  full ROM SKIN: warm, color normal PSYCH: no abnormalities of mood noted, alert and oriented to situation  ED Results / Procedures / Treatments   Labs (all labs ordered are listed, but only abnormal results are displayed) Labs Reviewed  COMPREHENSIVE METABOLIC PANEL - Abnormal; Notable for the following components:      Result Value   Sodium 133 (*)    Glucose, Bld 101 (*)    Total Protein 8.3 (*)    All other components within normal limits  URINALYSIS, ROUTINE W REFLEX MICROSCOPIC - Abnormal; Notable for  the following components:   Color, Urine STRAW (*)    All other components within normal limits  LIPASE, BLOOD  CBC    EKG ED ECG REPORT   Date: 10/20/2023 0354am  Rate: 53  Rhythm: sinus bradycardia  QRS Axis: normal  Intervals: normal  ST/T Wave abnormalities: normal  Conduction Disutrbances:none   I have personally reviewed the EKG tracing and agree with the computerized printout as noted.   Radiology DG Abdomen Acute W/Chest Result Date:  10/20/2023 CLINICAL DATA:  Pain. EXAM: DG ABDOMEN ACUTE WITH 1 VIEW CHEST COMPARISON:  CT 10/13/2023 FINDINGS: The lungs are clear. The heart is normal in size. No focal airspace disease. No pleural fluid or pneumothorax. No free intra-abdominal air. No bowel dilatation to suggest obstruction. Small to moderate colonic stool burden. Single air-fluid level within the loop of small bowel in the left abdomen. There are pelvic phleboliths. No acute osseous abnormalities. IMPRESSION: 1. No acute chest findings. 2. Nonobstructive bowel gas pattern. Small to moderate colonic stool burden. Electronically Signed   By: Andrea Gasman M.D.   On: 10/20/2023 03:37    Procedures Procedures    Medications Ordered in ED Medications  acetaminophen  (TYLENOL ) tablet 650 mg (650 mg Oral Given 10/20/23 0247)  alum & mag hydroxide-simeth (MAALOX/MYLANTA) 200-200-20 MG/5ML suspension 30 mL (30 mLs Oral Given 10/20/23 0313)  amoxicillin -clavulanate (AUGMENTIN ) 875-125 MG per tablet 1 tablet (1 tablet Oral Given 10/20/23 0504)    ED Course/ Medical Decision Making/ A&P Clinical Course as of 10/20/23 0528  [place] Oct 20, 2023  0248 Patient with a bout of diverticulitis in November, and then a bout a diverticulitis at the end of December.  Given Augmentin  both times.  Reports pain continuing now having pain in her upper abdomen She is in no acute distress. [DW]  9662 Overall patient well appearing.  Initial plan to avoid CT imaging, was to give Maalox for her heartburn and do acute abdominal series.  On further discussion, patient was concerned about her worsening pain.  I then offered CT imaging, but she refuses due to radiation exposure.  After long discussion, initial plan will be to give Maalox, encourage p.o. and check acute abdominal series.  Fortunately she is not septic appearing, she is not vomiting and her white count is normal [DW]  0527 Overall patient feels improved.  She was able to eat crackers and take an  additional dose of Augmentin .  She is in no acute distress.  I have low suspicion for acute worsening of her diverticulitis.  She has several days left of her antibiotics.  Advised that she will need to have close follow-up with GI once this resolved as she will likely need to have another colonoscopy [DW]    Clinical Course User Index [DW] Midge Golas, MD                                 Medical Decision Making Amount and/or Complexity of Data Reviewed Labs: ordered. Radiology: ordered. ECG/medicine tests: ordered.  Risk OTC drugs. Prescription drug management.   This patient presents to the ED for concern of abdominal pain, this involves an extensive number of treatment options, and is a complaint that carries with it a high risk of complications and morbidity.  The differential diagnosis includes but is not limited to cholecystitis, cholelithiasis, pancreatitis, gastritis, peptic ulcer disease, appendicitis, bowel obstruction, bowel perforation, diverticulitis, AAA, ischemic bowel    Comorbidities  that complicate the patient evaluation: Patient's presentation is complicated by their history of anxiety   Additional history obtained: Additional history obtained from spouse  Lab Tests: I Ordered, and personally interpreted labs.  The pertinent results include: Labs overall unremarkable  Imaging Studies ordered: I ordered imaging studies including X-ray acute abdominal series   I independently visualized and interpreted imaging which showed no acute findings I agree with the radiologist interpretation   Medicines ordered and prescription drug management: I ordered medication including: Tylenol  for pain Reevaluation of the patient after these medicines showed that the patient    improved  Reevaluation: After the interventions noted above, I reevaluated the patient and found that they have :improved  Complexity of problems addressed: Patient's presentation is most  consistent with  acute presentation with potential threat to life or bodily function  Disposition: After consideration of the diagnostic results and the patient's response to treatment,  I feel that the patent would benefit from discharge   .           Final Clinical Impression(s) / ED Diagnoses Final diagnoses:  Epigastric pain    Rx / DC Orders ED Discharge Orders     None         Midge Golas, MD 10/20/23 [phone]

## 2023-10-20 NOTE — ED Notes (Addendum)
 Pt given crackers with augmentin. Afterwards, pt denies any nausea or pain. PO challenge passed. Pt states she if feeling better and is ready to be discharged.

## 2023-11-10 ENCOUNTER — Other Ambulatory Visit: Payer: Self-pay | Admitting: Gastroenterology

## 2023-11-10 DIAGNOSIS — R109 Unspecified abdominal pain: Secondary | ICD-10-CM

## 2023-11-13 NOTE — Progress Notes (Signed)
 Cardiology Office Note:    Date:  11/21/2023   ID:  Susan Abbott, DOB 26-Jan-1966, MRN [id number]  PCP:  Rosamond Leta NOVAK, MD  Cardiologist:  Lynwood Schilling, MD     Referring MD: Rosamond Leta NOVAK, MD   Chief Complaint: follow-up of CAD  History of Present Illness:    Susan Abbott is a 58 y.o. female with a history of chest pain with mild non-obstructive CAD on coronary CTA in 04/2021, remote SVT, hypertension, dyslipidemia, TIA in 2020, GERD, IBS, diverticulitis, and anxiety who is followed by Dr. Schilling who presents today for routine follow-up.   Patient was referred to Dr. Schilling in 01/2021 for further evaluation of chest pain. Prior Echo in 07/2019 showed LVEF of 55-60% with mild LVH and grade 2 diastolic dysfunction. A coronary calcium score was ordered and came back at 264 (98th percentile for age and sex). ETT was then ordered and showed some horizontal ST depression concerning for ischemia. Therefore, a coronary CT was ordered for further evaluation and showed a coronary calcium score of 249 (98th percentile for age and sex) and scattered multivessel calcified plaque (0-24% stenosis).   She was last seen by Dr. Schilling in 08/2021 at which time she noted sporadic chest discomfort but this was not felt to be cardiac in nature.   She has had several ED visits over the last couple of months for abdominal pain  due to diverticulitis. She was most recently seen in the ED on 10/20/2023 for right upper abdominal pain and diarrhea. EKG showed sinus bradycardia with no acute ischemic changes. Patient declined repeat CT due to radiation exposure. Abdominal x-ray showed small to moderate colonic stool burden and non-obstructive bowel gas pattern.   Patient presets today for follow-up. Here alone. She has been having a lot of GI issues. She states she has not been able to eat since Thanksgiving and is having a lot of diffuse abdominal pain. She has reproducible pain with palpation of  epigastric area. She states this radiates some up to her chest some. She also describes atypical chest pain. She continues to have random episodes of sharp pain that last for 1-2 minutes at a time and then resolve on its own. This is the same chest pain that she was having 2 years ago when she had the coronary CTA. She does state it has been occurring a little more frequently with all her GI issues. She also has left sided chest wall soreness that is reproducible with palpation of that area and sounds musculoskeletal in nature. None of her chest pain sounds cardiac. She has seen GI and is scheduled to have endoscopy/ colonoscopy in March.   She reports chronic shortness of breath with exertion every since she was started on Toprol-XL in 2022. This is stable. Offered to change Toprol-XL but she declines. No new or worsening shortness of breath. No clear orthopnea or PND. No edema. No recent palpitations. She reports random episodes of mild lightheadedness/ dizziness but these episodes do not last long. No syncope. She does have a history of panic attacks.   EKGs/Labs/Other Studies Reviewed:    The following studies were reviewed:  Echocardiogram 08/14/2019: Impressions: 1. Left ventricular ejection fraction, by visual estimation, is 55 to  60%. The left ventricle has normal function. Normal left ventricular size.  Left ventricular septal wall thickness was mildly increased. Mildly  increased left ventricular posterior  wall thickness. There is mildly increased left ventricular hypertrophy.   2. Left ventricular  diastolic Doppler parameters are consistent with  pseudonormalization pattern of LV diastolic filling.   3. Global right ventricle has normal systolic function.The right  ventricular size is normal. No increase in right ventricular wall  thickness.   4. Left atrial size was normal.   5. Right atrial size was normal.   6. Trivial pericardial effusion is present.   7. The mitral valve is  normal in structure. Trace mitral valve  regurgitation. No evidence of mitral stenosis.   8. The tricuspid valve is normal in structure. Tricuspid valve  regurgitation is trivial.   9. The aortic valve is normal in structure. Aortic valve regurgitation  was not visualized by color flow Doppler. Structurally normal aortic  valve, with no evidence of sclerosis or stenosis.  10. The pulmonic valve was normal in structure. Pulmonic valve  regurgitation is not visualized by color flow Doppler.  11. Normal pulmonary artery systolic pressure.  12. The inferior vena cava is normal in size with greater than 50%  respiratory variability, suggesting right atrial pressure of 3 mmHg.  _______________  Coronary CTA 05/07/2021: Impressions: 1. Coronary calcium score of 249. This was 21 percentile for age and sex matched control. 2. Normal coronary origin with right dominance. 3. There is scattered multivessel calcified plaque that within segments visualized appears non flow limiting (0-24% stenosis).   EKG:  EKG ordered today.   EKG Interpretation Date/Time:  Tuesday November 21 2023 11:23:25 EST Ventricular Rate:  53 PR Interval:  148 QRS Duration:  86 QT Interval:  426 QTC Calculation: 399 R Axis:   15  Text Interpretation: Sinus bradycardia No significant ST/ T wave changes. When compared with ECG of 20-Oct-2023 03:54, No significant change was found Confirmed by Dailin Sosnowski [phone]) on 11/21/2023 12:29:16 PM    Recent Labs: 10/19/2023: ALT 25; BUN 8; Creatinine, Ser 0.67; Hemoglobin 14.8; Platelets 247; Potassium 3.9; Sodium 133  Recent Lipid Panel No results found for: CHOL, TRIG, HDL, CHOLHDL, VLDL, LDLCALC, LDLDIRECT  Physical Exam:    Vital Signs: BP 108/74   Pulse 86   Ht 5' 3 (1.6 m)   Wt 137 lb (62.1 kg)   SpO2 95%   BMI 24.27 kg/m     Wt Readings from Last 3 Encounters:  11/21/23 137 lb (62.1 kg)  10/13/23 145 lb (65.8 kg)  09/10/23 143 lb 4.8 oz (65  kg)     General: 58 y.o. thin Caucasian female in no acute distress. HEENT: Normocephalic and atraumatic. Sclera clear.  Neck: Supple. No JVD. Heart: RRR. Distinct S1 and S2. No murmurs, gallops, or rubs. Left-sided chest wall tenderness with palpation.  Lungs: No increased work of breathing. Clear to ausculation bilaterally. No wheezes, rhonchi, or rales.  Abdomen: Soft and non-distended. Tenderness to palpation of epigastric area.  Extremities: No lower extremity edema.   Skin: Warm and dry. Neuro: No focal deficits. Psych: Normal affect. Responds appropriately.  Assessment:    1. Coronary artery disease involving native coronary artery of native heart without angina pectoris   2. Primary hypertension   3. Dyslipidemia   4. Pre-op evaluation   5. Paroxysmal SVT (supraventricular tachycardia) (HCC)     Plan:    Mild Non-Obstructive CAD Coronary CTA in 04/2021 showed a coronary calcium score of 249 (98th percentile for age and sex) and scattered multivessel calcified plaque (0-24% stenosis).  - EKG today shows no acute ischemic changes.  - Patient has atypical chest pain but nothing that sounds like angina. - Patient has declined  statins. Discussed starting Aspirin  but with all her GI issues she declines this. - Chest pain does not sound cardiac. Suspect most of her symptoms are GI related. No additional ischemic work-up necessary.   Paroxysmal SVT Patient has a remote history of SVT decades ago. - Stable. No recent palpitations.  - Continue Toprol-XL 25mg  daily.  - Of note, patient states she has had some shortness of breath since starting the Toprol-XL. Offered to switch to a different medication but patient declined.   Hypertension BP well controlled. She does describes occasional spikes in her BP at home but she associates this with anxiety. - Continue Toprol-XL 25mg  daily.  - Advised patient to let us  know if BP consistently >130/80 at home.   Dyslipidemia Lipid panel  in 12/2021: Total Cholesterol 223, Triglycerides 141, HDL 50, LDL 148. LDL goal <70 given.  - Patient has declined statins in the past. She continues to decline this today. - She is not fasting today but will come back for lipid panel and LFTs another day.   Pre-Op Evaluation Patient is scheduled to have endoscopy/ colonoscopy in 12/2023. She has atypical chest pain that does not sound cardiac in nature. Most of her symptoms sound GI in nature. Otherwise, stable from a cardiac standpoint. Therefore, okay to proceed with planned GI procedures without any additional cardiac work-up.   Disposition: Follow up in 6 months.    Signed, Aline FORBES Door, PA-C  11/21/2023 12:35 PM    [facility]

## 2023-11-21 ENCOUNTER — Ambulatory Visit
Admission: RE | Admit: 2023-11-21 | Discharge: 2023-11-21 | Disposition: A | Discharge status: Home / Self Care | Payer: Commercial Managed Care - PPO | Source: Ambulatory Visit | Attending: Gastroenterology | Admitting: Gastroenterology

## 2023-11-21 ENCOUNTER — Ambulatory Visit: Payer: Commercial Managed Care - PPO | Attending: Student | Admitting: Student

## 2023-11-21 ENCOUNTER — Encounter: Payer: Self-pay | Admitting: Student

## 2023-11-21 VITALS — BP 108/74 | HR 86 | Ht 63.0 in | Wt 137.0 lb

## 2023-11-21 DIAGNOSIS — E785 Hyperlipidemia, unspecified: Secondary | ICD-10-CM

## 2023-11-21 DIAGNOSIS — R109 Unspecified abdominal pain: Secondary | ICD-10-CM

## 2023-11-21 DIAGNOSIS — I10 Essential (primary) hypertension: Secondary | ICD-10-CM | POA: Diagnosis not present

## 2023-11-21 DIAGNOSIS — I471 Supraventricular tachycardia, unspecified: Secondary | ICD-10-CM

## 2023-11-21 DIAGNOSIS — Z01818 Encounter for other preprocedural examination: Secondary | ICD-10-CM

## 2023-11-21 DIAGNOSIS — I2510 Atherosclerotic heart disease of native coronary artery without angina pectoris: Secondary | ICD-10-CM

## 2023-11-21 NOTE — Patient Instructions (Signed)
Medication Instructions:  The current medical regimen is effective;  continue present plan and medications as directed. Please refer to the Current Medication list given to you today.  *If you need a refill on your cardiac medications before your next appointment, please call your pharmacy*  Lab Work: FASTING LIPID AND LFT If you have labs (blood work) drawn today and your tests are completely normal, you will receive your results only by:  MyChart Message (if you have MyChart) OR  A paper copy in the mail If you have any lab test that is abnormal or we need to change your treatment, we will call you to review the results.  Testing/Procedures: NONE  Follow-Up: At [facility], you and your health needs are our priority.  As part of our continuing mission to provide you with exceptional heart care, we have created designated Provider Care Teams.  These Care Teams include your primary Cardiologist (physician) and Advanced Practice Providers (APPs -  Physician Assistants and Nurse Practitioners) who all work together to provide you with the care you need, when you need it.  Your next appointment:   6 month(s)  Provider:   Rollene Rotunda, MD  or Marjie Skiff, PA-C

## 2023-12-31 ENCOUNTER — Other Ambulatory Visit: Payer: Self-pay

## 2023-12-31 ENCOUNTER — Emergency Department (HOSPITAL_BASED_OUTPATIENT_CLINIC_OR_DEPARTMENT_OTHER)
Admission: EM | Admit: 2023-12-31 | Discharge: 2023-12-31 | Disposition: A | Discharge status: Home / Self Care | Attending: Emergency Medicine | Admitting: Emergency Medicine

## 2023-12-31 DIAGNOSIS — R195 Other fecal abnormalities: Secondary | ICD-10-CM

## 2023-12-31 DIAGNOSIS — K921 Melena: Secondary | ICD-10-CM | POA: Diagnosis present

## 2023-12-31 LAB — CBC WITH DIFFERENTIAL/PLATELET
Abs Immature Granulocytes: 0.01 10*3/uL (ref 0.00–0.07)
Basophils Absolute: 0 10*3/uL (ref 0.0–0.1)
Basophils Relative: 1 %
Eosinophils Absolute: 0.1 10*3/uL (ref 0.0–0.5)
Eosinophils Relative: 1 %
HCT: 40.8 % (ref 36.0–46.0)
Hemoglobin: 13.6 g/dL (ref 12.0–15.0)
Immature Granulocytes: 0 %
Lymphocytes Relative: 32 %
Lymphs Abs: 1.6 10*3/uL (ref 0.7–4.0)
MCH: 28.8 pg (ref 26.0–34.0)
MCHC: 33.3 g/dL (ref 30.0–36.0)
MCV: 86.4 fL (ref 80.0–100.0)
Monocytes Absolute: 0.3 10*3/uL (ref 0.1–1.0)
Monocytes Relative: 7 %
Neutro Abs: 3 10*3/uL (ref 1.7–7.7)
Neutrophils Relative %: 59 %
Platelets: 207 10*3/uL (ref 150–400)
RBC: 4.72 MIL/uL (ref 3.87–5.11)
RDW: 12.5 % (ref 11.5–15.5)
WBC: 5.1 10*3/uL (ref 4.0–10.5)
nRBC: 0 % (ref 0.0–0.2)

## 2023-12-31 LAB — BASIC METABOLIC PANEL
Anion gap: 7 (ref 5–15)
BUN: 6 mg/dL (ref 6–20)
CO2: 26 mmol/L (ref 22–32)
Calcium: 8.9 mg/dL (ref 8.9–10.3)
Chloride: 108 mmol/L (ref 98–111)
Creatinine, Ser: 0.77 mg/dL (ref 0.44–1.00)
GFR, Estimated: >60 mL/min (ref >60)
Glucose, Bld: 94 mg/dL (ref 70–99)
Potassium: 3.6 mmol/L (ref 3.5–5.1)
Sodium: 141 mmol/L (ref 135–145)

## 2023-12-31 LAB — OCCULT BLOOD X 1 CARD TO LAB, STOOL: Fecal Occult Bld: NEGATIVE

## 2023-12-31 NOTE — ED Provider Notes (Signed)
 [facility] Provider Note   CSN: [id number] Arrival date & time: 12/31/23  8413     History  Chief Complaint  Patient presents with   Blood In Stools    Susan Abbott is a 58 y.o. female.  The history is provided by the patient.  She has history of GERD and had upper endoscopy done on 3/11.  Tonight, she noted some bright red blood in her stool.  She has history of hemorrhoids but states that this was different since blood and blood was in the stool and not when she wipes and not on the outside of the stool.  She has been having ongoing problems with burning abdominal pain in spite of taking all of her medications for GERD.  She also thought that the hemorrhoids might be causing problems and she has been using Preparation H.   Home Medications Prior to Admission medications   Medication Sig Start Date End Date Taking? Authorizing Provider  acetaminophen (TYLENOL) 325 MG tablet Take 650 mg by mouth every 6 (six) hours as needed for mild pain.    [provider]  ALPRAZolam Prudy Feeler) 0.5 MG tablet Take 0.5 mg by mouth 2 (two) times daily.    [provider]  diazepam (VALIUM) 5 MG tablet Take 5 mg by mouth daily as needed for anxiety. Patient not taking: Reported on 11/21/2023    [provider]  esomeprazole (NEXIUM) 40 MG capsule Take 40 mg by mouth 2 (two) times daily.    [provider]  fluconazole (DIFLUCAN) 150 MG tablet Take 150 mg by mouth once. Patient not taking: Reported on 11/21/2023 09/10/23   [provider]  metoprolol succinate (TOPROL-XL) 25 MG 24 hr tablet Take 1 tablet by mouth daily. 02/04/21   [provider]  Probiotic Product (ALIGN) 4 MG CAPS Take 1 capsule (4 mg total) by mouth daily. Patient not taking: Reported on 11/21/2023 09/10/23   Jacalyn Lefevre, MD  sucralfate (CARAFATE) 1 g tablet Take 1 tablet (1 g total) by mouth with breakfast, with lunch, and with  evening meal for 7 days. 10/14/23 10/21/23  Sloan Leiter, DO      Allergies    Doxycycline, Shellfish allergy, and Solu-medrol [methylprednisolone sodium succ]    Review of Systems   Review of Systems  All other systems reviewed and are negative.   Physical Exam Updated Vital Signs BP (!) 161/87 (BP Location: Right Arm)   Pulse (!) 49   Temp 97.8 F (36.6 C) (Oral)   Resp 18   Ht 5\' 3"  (1.6 m)   Wt 56.8 kg   SpO2 100%   BMI 22.18 kg/m  Physical Exam Vitals and nursing note reviewed.   58 year old female, resting comfortably and in no acute distress. Vital signs are significant for elevated blood pressure and slow heart rate. Oxygen saturation is 100%, which is normal. Head is normocephalic and atraumatic. PERRLA, EOMI. Oropharynx is clear.  Conjunctivae are pink. Neck is nontender and supple without adenopathy. Lungs are clear without rales, wheezes, or rhonchi. Chest is nontender. Heart has regular rate and rhythm without murmur. Abdomen is soft, flat, nontender. Rectal: Normal sphincter tone, no obvious hemorrhoids noted.  Small amount of light brown stool present without any obvious blood.  Specimen has been sent for Hemoccult testing. Extremities have no cyanosis or edema. Skin is warm and dry without rash. Neurologic: Mental status is normal, cranial nerves are intact, moves all extremities  equally.  ED Results / Procedures / Treatments   Labs (all labs ordered are listed, but only abnormal results are displayed) Labs Reviewed  BASIC METABOLIC PANEL  CBC WITH DIFFERENTIAL/PLATELET  OCCULT BLOOD X 1 CARD TO LAB, STOOL  POC OCCULT BLOOD, ED    Procedures Procedures    Medications Ordered in ED Medications - No data to display  ED Course/ Medical Decision Making/ A&P                                 Medical Decision Making Amount and/or Complexity of Data Reviewed Labs: ordered.   Report of rectal bleeding without bleeding seen on exam.  I have ordered  CBC and basic metabolic panel as well as stool Hemoccult testing.  I have reviewed her laboratory tests, and my interpretation is normal CBC, normal basic metabolic panel, negative fecal occult blood.  No evidence of any actual bleeding.  I have explained this to the patient, I am discharging her with instructions to continue working with her gastroenterologist regarding management of her acid reflux.  Final Clinical Impression(s) / ED Diagnoses Final diagnoses:  Red stool    Rx / DC Orders ED Discharge Orders     None         Dione Booze, MD 12/31/23 [phone]

## 2023-12-31 NOTE — Discharge Instructions (Addendum)
 Your evaluation not show any evidence of blood in the stool or any evidence of significant blood loss.  Continue to work with your gastroenterologist regarding management of your acid reflux.

## 2023-12-31 NOTE — ED Triage Notes (Signed)
 Pt presents with c/o blood in stool this morning. Pt had EGD on last Tuesday 3/11. Pt has hx of hemorrhoids. Pt reports pain from gastritis.

## 2024-08-13 ENCOUNTER — Other Ambulatory Visit (HOSPITAL_BASED_OUTPATIENT_CLINIC_OR_DEPARTMENT_OTHER): Payer: Self-pay

## 2024-08-13 MED ORDER — FLUZONE 0.5 ML IM SUSY
0.5000 mL | PREFILLED_SYRINGE | Freq: Once | INTRAMUSCULAR | 0 refills | Status: AC
  Filled 2024-08-13: qty 0.5, 1d supply, fill #0

## 2024-10-25 ENCOUNTER — Ambulatory Visit: Admitting: Cardiology

## 2024-11-11 DIAGNOSIS — E785 Hyperlipidemia, unspecified: Secondary | ICD-10-CM | POA: Insufficient documentation

## 2024-11-11 DIAGNOSIS — I471 Supraventricular tachycardia, unspecified: Secondary | ICD-10-CM | POA: Insufficient documentation

## 2024-11-11 DIAGNOSIS — I2510 Atherosclerotic heart disease of native coronary artery without angina pectoris: Secondary | ICD-10-CM | POA: Insufficient documentation

## 2024-11-11 NOTE — Progress Notes (Unsigned)
 " Cardiology Office Note:   Date:  11/11/2024  ID:  Susan Abbott, DOB 1966/02/20, MRN [id number] PCP: Rosamond Leta NOVAK, MD  [facility] Providers Cardiologist:  Lynwood Schilling, MD {  History of Present Illness:   Susan Abbott is a 59 y.o. female with a history of chest pain with mild non-obstructive CAD on coronary CTA in 04/2021, remote SVT, hypertension, dyslipidemia, TIA in 2020, GERD, IBS, diverticulitis, and anxiety who I saw in 2022 for evaluation of chest pain.  Prior Echo in 07/2019 showed LVEF of 55-60% with mild LVH and grade 2 diastolic dysfunction. A coronary calcium score was ordered and came back at 264 (98th percentile for age and sex). ETT was then ordered and showed some horizontal ST depression concerning for ischemia. Therefore, a coronary CT was ordered for further evaluation and showed a coronary calcium score of 249 (98th percentile for age and sex) and scattered multivessel calcified plaque (0-24% stenosis).   We saw her most recently in Feb 2025 preop endoscopy.  ***    She returns for follow up.  ***   ***  She was last seen by Dr. Schilling in 08/2021 at which time she noted sporadic chest discomfort but this was not felt to be cardiac in nature.    She has had several ED visits over the last couple of months for abdominal pain  due to diverticulitis. She was most recently seen in the ED on 10/20/2023 for right upper abdominal pain and diarrhea. EKG showed sinus bradycardia with no acute ischemic changes. Patient declined repeat CT due to radiation exposure. Abdominal x-ray showed small to moderate colonic stool burden and non-obstructive bowel gas pattern.    Patient presets today for follow-up. Here alone. She has been having a lot of GI issues. She states she has not been able to eat since Thanksgiving and is having a lot of diffuse abdominal pain. She has reproducible pain with palpation of epigastric area. She states this radiates some up to her chest some.  She also describes atypical chest pain. She continues to have random episodes of sharp pain that last for 1-2 minutes at a time and then resolve on its own. This is the same chest pain that she was having 2 years ago when she had the coronary CTA. She does state it has been occurring a little more frequently with all her GI issues. She also has left sided chest wall soreness that is reproducible with palpation of that area and sounds musculoskeletal in nature. None of her chest pain sounds cardiac. She has seen GI and is scheduled to have endoscopy/ colonoscopy in March.    She reports chronic shortness of breath with exertion every since she was started on Toprol-XL in 2022. This is stable. Offered to change Toprol-XL but she declines. No new or worsening shortness of breath. No clear orthopnea or PND. No edema. No recent palpitations. She reports random episodes of mild lightheadedness/ dizziness but these episodes do not last long. No syncope. She does have a history of panic attacks.   ROS: ***  Studies Reviewed:    EKG:       ***  Risk Assessment/Calculations:   {Does this patient have ATRIAL FIBRILLATION?[phone]} No BP recorded.  {Refresh Note OR Click here to enter BP  :1}***        Physical Exam:   VS:  There were no vitals taken for this visit.   Wt Readings from Last 3 Encounters:  12/31/23 125  lb 3.2 oz (56.8 kg)  11/21/23 137 lb (62.1 kg)  10/13/23 145 lb (65.8 kg)     GEN: Well nourished, well developed in no acute distress NECK: No JVD; No carotid bruits CARDIAC: ***RR, *** murmurs, rubs, gallops RESPIRATORY:  Clear to auscultation without rales, wheezing or rhonchi  ABDOMEN: Soft, non-tender, non-distended EXTREMITIES:  No edema; No deformity   ASSESSMENT AND PLAN:   Non-Obstructive CAD:  ***  Coronary CTA in 04/2021 showed a coronary calcium score of 249 (98th percentile for age and sex) and scattered multivessel calcified plaque (0-24% stenosis).  - EKG today  shows no acute ischemic changes.  - Patient has atypical chest pain but nothing that sounds like angina. - Patient has declined statins. Discussed starting Aspirin  but with all her GI issues she declines this. - Chest pain does not sound cardiac. Suspect most of her symptoms are GI related. No additional ischemic work-up necessary.    Paroxysmal SVT:  ***  Patient has a remote history of SVT decades ago. - Stable. No recent palpitations.  - Continue Toprol-XL 25mg  daily.  - Of note, patient states she has had some shortness of breath since starting the Toprol-XL. Offered to switch to a different medication but patient declined.    Hypertension:  ***  BP well controlled. She does describes occasional spikes in her BP at home but she associates this with anxiety. - Continue Toprol-XL 25mg  daily.  - Advised patient to let us  know if BP consistently >130/80 at home.    Dyslipidemia:  ***  Lipid panel in 12/2021: Total Cholesterol 223, Triglycerides 141, HDL 50, LDL 148. LDL goal <70 given.  - Patient has declined statins in the past. She continues to decline this today. - She is not fasting today but will come back for lipid panel and LFTs another day.    Pre-Op Evaluation:  ***  Patient is scheduled to have endoscopy/ colonoscopy in 12/2023. She has atypical chest pain that does not sound cardiac in nature. Most of her symptoms sound GI in nature. Otherwise, stable from a cardiac standpoint. Therefore, okay to proceed with planned GI procedures without any additional cardiac work-up.      Follow up ***  Signed, Lynwood Schilling, MD   "

## 2024-11-12 ENCOUNTER — Ambulatory Visit: Admitting: Cardiology

## 2024-11-12 DIAGNOSIS — I2510 Atherosclerotic heart disease of native coronary artery without angina pectoris: Secondary | ICD-10-CM

## 2024-11-12 DIAGNOSIS — I10 Essential (primary) hypertension: Secondary | ICD-10-CM

## 2024-11-12 DIAGNOSIS — E785 Hyperlipidemia, unspecified: Secondary | ICD-10-CM

## 2024-11-12 DIAGNOSIS — I471 Supraventricular tachycardia, unspecified: Secondary | ICD-10-CM

## 2024-12-10 ENCOUNTER — Ambulatory Visit: Admitting: Cardiology
